# Patient Record
Sex: Male | Born: 1965 | Race: Black or African American | Hispanic: No | Marital: Single | Smoking: Never smoker
Health system: Southern US, Community
[De-identification: ages and names within clinical notes are randomized; demographics above are authoritative.]

## PROBLEM LIST (undated history)

## (undated) DIAGNOSIS — N400 Enlarged prostate without lower urinary tract symptoms: Secondary | ICD-10-CM

## (undated) HISTORY — DX: Benign prostatic hyperplasia without lower urinary tract symptoms: N40.0

---

## 2013-10-07 DIAGNOSIS — K56609 Unspecified intestinal obstruction, unspecified as to partial versus complete obstruction: Secondary | ICD-10-CM

## 2013-10-07 HISTORY — DX: Unspecified intestinal obstruction, unspecified as to partial versus complete obstruction: K56.609

## 2017-04-03 ENCOUNTER — Emergency Department: Payer: BC Managed Care – PPO

## 2017-04-03 ENCOUNTER — Encounter: Payer: Self-pay | Admitting: Emergency Medicine

## 2017-04-03 ENCOUNTER — Emergency Department
Admission: EM | Admit: 2017-04-03 | Discharge: 2017-04-03 | Disposition: A | Discharge status: Home / Self Care | Payer: BC Managed Care – PPO | Attending: Emergency Medicine | Admitting: Emergency Medicine

## 2017-04-03 DIAGNOSIS — R3911 Hesitancy of micturition: Secondary | ICD-10-CM | POA: Diagnosis not present

## 2017-04-03 DIAGNOSIS — R1030 Lower abdominal pain, unspecified: Secondary | ICD-10-CM | POA: Diagnosis not present

## 2017-04-03 DIAGNOSIS — R109 Unspecified abdominal pain: Secondary | ICD-10-CM | POA: Diagnosis present

## 2017-04-03 DIAGNOSIS — N50819 Testicular pain, unspecified: Secondary | ICD-10-CM

## 2017-04-03 LAB — CBC WITH DIFFERENTIAL/PLATELET
BASOS ABS: 0 10*3/uL (ref 0–0.1)
Basophils Relative: 1 %
Eosinophils Absolute: 0.2 10*3/uL (ref 0–0.7)
Eosinophils Relative: 3 %
HEMATOCRIT: 45.2 % (ref 40.0–52.0)
Hemoglobin: 15.1 g/dL (ref 13.0–18.0)
LYMPHS ABS: 2 10*3/uL (ref 1.0–3.6)
LYMPHS PCT: 34 %
MCH: 28 pg (ref 26.0–34.0)
MCHC: 33.5 g/dL (ref 32.0–36.0)
MCV: 83.5 fL (ref 80.0–100.0)
MONO ABS: 0.6 10*3/uL (ref 0.2–1.0)
Monocytes Relative: 10 %
NEUTROS ABS: 3 10*3/uL (ref 1.4–6.5)
Neutrophils Relative %: 52 %
Platelets: 226 10*3/uL (ref 150–440)
RBC: 5.41 MIL/uL (ref 4.40–5.90)
RDW: 13.8 % (ref 11.5–14.5)
WBC: 5.7 10*3/uL (ref 3.8–10.6)

## 2017-04-03 LAB — BASIC METABOLIC PANEL
ANION GAP: 9 (ref 5–15)
BUN: 12 mg/dL (ref 6–20)
CO2: 24 mmol/L (ref 22–32)
Calcium: 9.4 mg/dL (ref 8.9–10.3)
Chloride: 108 mmol/L (ref 101–111)
Creatinine, Ser: 1.16 mg/dL (ref 0.61–1.24)
GFR calc Af Amer: >60 mL/min (ref >60)
GFR calc non Af Amer: >60 mL/min (ref >60)
GLUCOSE: 95 mg/dL (ref 65–99)
POTASSIUM: 3.7 mmol/L (ref 3.5–5.1)
Sodium: 141 mmol/L (ref 135–145)

## 2017-04-03 LAB — URINALYSIS, COMPLETE (UACMP) WITH MICROSCOPIC
BACTERIA UA: NONE SEEN
BILIRUBIN URINE: NEGATIVE
Glucose, UA: NEGATIVE mg/dL
Hgb urine dipstick: NEGATIVE
KETONES UR: 20 mg/dL — AB
LEUKOCYTES UA: NEGATIVE
Nitrite: NEGATIVE
PROTEIN: NEGATIVE mg/dL
Specific Gravity, Urine: 1.025 (ref 1.005–1.030)
pH: 5 (ref 5.0–8.0)

## 2017-04-03 MED ORDER — SODIUM CHLORIDE 0.9 % IV BOLUS (SEPSIS)
1000.0000 mL | Freq: Once | INTRAVENOUS | Status: AC
  Administered 2017-04-03: 1000 mL via INTRAVENOUS

## 2017-04-03 MED ORDER — MORPHINE SULFATE (PF) 4 MG/ML IV SOLN
4.0000 mg | Freq: Once | INTRAVENOUS | Status: AC
  Administered 2017-04-03: 4 mg via INTRAVENOUS
  Filled 2017-04-03: qty 1

## 2017-04-03 MED ORDER — TAMSULOSIN HCL 0.4 MG PO CAPS: 0.4000 mg | ORAL_CAPSULE | Freq: Every day | ORAL | 0 refills | Status: DC

## 2017-04-03 MED ORDER — ONDANSETRON HCL 4 MG/2ML IJ SOLN
4.0000 mg | Freq: Once | INTRAMUSCULAR | Status: AC
  Administered 2017-04-03: 4 mg via INTRAVENOUS
  Filled 2017-04-03: qty 2

## 2017-04-03 NOTE — ED Provider Notes (Signed)
[facility] Emergency Department Provider Note   ____________________________________________   First MD Initiated Contact with Patient 04/03/17 0502     (approximate)  I have reviewed the triage vital signs and the nursing notes.   HISTORY  Chief Complaint Abdominal Pain    HPI Adam Woodward is a 51 y.o. male brought to the ED from home via EMS with a chief complaint of low abdominal pain. Patient reports low abdominal pain since January 2018. [facility] with no concrete findings. States he has had increased pain for the past 2 days with radiation to his scrotum. Denies penile discharge or STD exposure. States he is slower to start his stream of urination but denies dysuria or hematuria. Denies associated fever, chills, chest pain, shortness of breath, nausea, vomiting, diarrhea.Denies recent travel or trauma. Nothing makes his symptoms better or worse.   Past medical history None  There are no active problems to display for this patient.   History reviewed. No pertinent surgical history.  Prior to Admission medications   Medication Sig Start Date End Date Taking? Authorizing Provider  tamsulosin (FLOMAX) 0.4 MG CAPS capsule Take 1 capsule (0.4 mg total) by mouth daily. 04/03/17   Irean HongSung, Lee-Anne Flicker J, MD    Allergies Patient has no known allergies.  History reviewed. No pertinent family history.  Social History Social History  Substance Use Topics  . Smoking status: Never Smoker  . Smokeless tobacco: Never Used  . Alcohol use No    Review of Systems  Constitutional: No fever/chills. Eyes: No visual changes. ENT: No sore throat. Cardiovascular: Denies chest pain. Respiratory: Denies shortness of breath. Gastrointestinal: Positive for abdominal pain.  No nausea, no vomiting.  No diarrhea.  No constipation. Genitourinary: Negative for dysuria. Musculoskeletal: Negative for back pain. Skin: Negative for  rash. Neurological: Negative for headaches, focal weakness or numbness.   ____________________________________________   PHYSICAL EXAM:  VITAL SIGNS: ED Triage Vitals  Enc Vitals Group     BP --      Pulse Rate 04/03/17 0416 73     Resp 04/03/17 0416 18     Temp 04/03/17 0416 98.3 F (36.8 C)     Temp Source 04/03/17 0416 Oral     SpO2 04/03/17 0412 100 %     Weight 04/03/17 0416 200 lb (90.7 kg)     Height 04/03/17 0416 6\' 1"  (1.854 m)     Head Circumference --      Peak Flow --      Pain Score 04/03/17 0415 8     Pain Loc --      Pain Edu? --      Excl. in GC? --     Constitutional: Alert and oriented. Well appearing and in no acute distress. Eyes: Conjunctivae are normal. PERRL. EOMI. Head: Atraumatic. Nose: No congestion/rhinnorhea. Mouth/Throat: Mucous membranes are moist.  Oropharynx non-erythematous. Neck: No stridor.   Cardiovascular: Normal rate, regular rhythm. Grossly normal heart sounds.  Good peripheral circulation. Respiratory: Normal respiratory effort.  No retractions. Lungs CTAB. Gastrointestinal: Soft and mildly tender to palpation suprapubic area without rebound or guarding. No distention. No abdominal bruits. No CVA tenderness. Genitourinary: Circumcised male. No urethral discharge. Bilaterally distended testicles that are not swollen but tender to palpation. Strong bilateral cremasteric reflexes. No inguinal lymphadenopathy. No skin changes, vesicles or ulcers. Musculoskeletal: No lower extremity tenderness nor edema.  No joint effusions. Neurologic:  Normal speech and language. No gross focal neurologic  deficits are appreciated. No gait instability. Skin:  Skin is warm, dry and intact. No rash noted. Psychiatric: Mood and affect are normal. Speech and behavior are normal.  ____________________________________________   LABS (all labs ordered are listed, but only abnormal results are displayed)  Labs Reviewed  URINALYSIS, COMPLETE (UACMP) WITH  MICROSCOPIC - Abnormal; Notable for the following:       Result Value   Color, Urine YELLOW (*)    APPearance CLEAR (*)    Ketones, ur 20 (*)    Squamous Epithelial / LPF 0-5 (*)    All other components within normal limits  CBC WITH DIFFERENTIAL/PLATELET  BASIC METABOLIC PANEL   ____________________________________________  EKG  None ____________________________________________  RADIOLOGY  US Scrotum  Result Date: 04/03/2017 CLINICAL DATA:  Bilateral testicular pain. EXAM: SCROTAL ULTRASOUND DOPPLER ULTRASOUND OF THE TESTICLES COMPARISON:  CT same day. TECHNIQUE: Complete ultrasound examination of the testicles, epididymis, and other scrotal structures was performed. Color and spectral Doppler ultrasound were also utilized to evaluate blood flow to the testicles. FINDINGS: Right testicle Measurements: 4.5 x 2.1 x 2.6 cm. No mass or microlithiasis visualized. Left testicle Measurements: 4.2 x 1.8 x 3.2 cm. No mass or microlithiasis visualized. Right epididymis:  Normal in size and appearance. Left epididymis:  Normal in size and appearance. Hydrocele:  None visualized. Varicocele:  None visualized. Pulsed Doppler interrogation of both testes demonstrates normal low resistance arterial and venous waveforms bilaterally. IMPRESSION: Negative exam. Electronically Signed   By: Maisie Fus  Register   On: 04/03/2017 06:17   [place] Art/ven Flow Abd Pelv Doppler  Result Date: 04/03/2017 CLINICAL DATA:  Bilateral testicular pain. EXAM: SCROTAL ULTRASOUND DOPPLER ULTRASOUND OF THE TESTICLES COMPARISON:  CT same day. TECHNIQUE: Complete ultrasound examination of the testicles, epididymis, and other scrotal structures was performed. Color and spectral Doppler ultrasound were also utilized to evaluate blood flow to the testicles. FINDINGS: Right testicle Measurements: 4.5 x 2.1 x 2.6 cm. No mass or microlithiasis visualized. Left testicle Measurements: 4.2 x 1.8 x 3.2 cm. No mass or microlithiasis visualized.  Right epididymis:  Normal in size and appearance. Left epididymis:  Normal in size and appearance. Hydrocele:  None visualized. Varicocele:  None visualized. Pulsed Doppler interrogation of both testes demonstrates normal low resistance arterial and venous waveforms bilaterally. IMPRESSION: Negative exam. Electronically Signed   By: Maisie Fus  Register   On: 04/03/2017 06:17   Ct Renal Stone Study  Result Date: 04/03/2017 CLINICAL DATA:  Lower abdominal pain, chronic. EXAM: CT ABDOMEN AND PELVIS WITHOUT CONTRAST TECHNIQUE: Multidetector CT imaging of the abdomen and pelvis was performed following the standard protocol without IV contrast. COMPARISON:  None. FINDINGS: Lower chest: No acute abnormality. Hepatobiliary: No focal liver abnormality is seen. No gallstones, gallbladder wall thickening, or biliary dilatation. Pancreas: Unremarkable. No pancreatic ductal dilatation or surrounding inflammatory changes. Spleen: Normal in size without focal abnormality. Adrenals/Urinary Tract: Adrenal glands are unremarkable. Kidneys are normal, without renal calculi, focal lesion, or hydronephrosis. Bladder is unremarkable. Stomach/Bowel: Stomach is within normal limits. Appendix is normal. No evidence of bowel wall thickening, distention, or inflammatory changes. Vascular/Lymphatic: No significant vascular findings are present. No enlarged abdominal or pelvic lymph nodes. Reproductive: Unremarkable Other: No focal inflammation.  No ascites. Musculoskeletal: No significant skeletal lesion. IMPRESSION: No significant abnormality. Electronically Signed   By: Ellery Plunk M.D.   On: 04/03/2017 06:21    ____________________________________________   PROCEDURES  Procedure(s) performed: None  Procedures  Critical Care performed: No  ____________________________________________   INITIAL IMPRESSION / ASSESSMENT  AND PLAN / ED COURSE  Pertinent labs & imaging results that were available during my care of the  patient were reviewed by me and considered in my medical decision making (see chart for details).  51 year old male with lower abdominal pain since January, worse for the past 2 days with associated urinary hesitancy and bilateral testicular pain. In addition to basic lab work, will obtain urinalysis, bladder scan, testicular ultrasound and CT renal colic study. Reviewed OSH chart from patient's January visit; CT abdomen/pelvis at that time demonstrated diverticulosis.  Clinical Course as of Apr 04 727  Thu Apr 03, 2017  1610 Radiologist Dr. Register called with negative ultrasound results. Updated patient of laboratory and negative CT results. Awaiting urinalysis. Bladder scan revealed only 40 mL some bladder. Anticipate patient will be able to be discharged home after urinalysis. Will prescribe antibiotics if needed. Will refer to urology for follow-up.  [JS]  0719 Patient resting in no acute distress. Updated him of urinalysis results. Will start him on a trial of Flomax for symptoms of urinary hesitancy and referred to urology for follow-up. Strict return precautions given. Patient verbalizes understanding and agrees with plan of care.  [JS]    Clinical Course User Index [JS] Irean Hong, MD     ____________________________________________   FINAL CLINICAL IMPRESSION(S) / ED DIAGNOSES  Final diagnoses:  Lower abdominal pain  Urinary hesitancy      NEW MEDICATIONS STARTED DURING THIS VISIT:  New Prescriptions   TAMSULOSIN (FLOMAX) 0.4 MG CAPS CAPSULE    Take 1 capsule (0.4 mg total) by mouth daily.     Note:  This document was prepared using Dragon voice recognition software and may include unintentional dictation errors.    Irean Hong, MD 04/03/17 [phone]

## 2017-04-03 NOTE — ED Notes (Signed)
Patient transported to CT 

## 2017-04-03 NOTE — ED Notes (Signed)
Ice pack applied to top of rt. Hand.

## 2017-04-03 NOTE — ED Triage Notes (Signed)
Pt. States chronic lower abdominal pain.  Pt. States no change in medication or diet.  Pt. Denies vomiting.

## 2017-04-03 NOTE — Discharge Instructions (Signed)
1. Start Flomax daily. 2. Please call to scheduled an appointment with the urologist to evaluate urinary hesitancy. 3. Return to the ER for worsening symptoms, persistent vomiting, difficulty breathing or other concerns.

## 2017-04-03 NOTE — ED Notes (Signed)
Pt. Reports dysuria with slow urine stream.

## 2017-04-03 NOTE — ED Notes (Signed)
Bladder scan only showed 40 ml in bladder.

## 2017-10-06 ENCOUNTER — Encounter: Payer: Self-pay | Admitting: Emergency Medicine

## 2017-10-06 ENCOUNTER — Other Ambulatory Visit: Payer: Self-pay

## 2017-10-06 ENCOUNTER — Ambulatory Visit
Admission: EM | Admit: 2017-10-06 | Discharge: 2017-10-06 | Disposition: A | Discharge status: Home / Self Care | Payer: BC Managed Care – PPO | Attending: Family Medicine | Admitting: Family Medicine

## 2017-10-06 DIAGNOSIS — K5900 Constipation, unspecified: Secondary | ICD-10-CM | POA: Diagnosis not present

## 2017-10-06 DIAGNOSIS — Z87438 Personal history of other diseases of male genital organs: Secondary | ICD-10-CM

## 2017-10-06 DIAGNOSIS — N411 Chronic prostatitis: Secondary | ICD-10-CM | POA: Diagnosis not present

## 2017-10-06 LAB — URINALYSIS, COMPLETE (UACMP) WITH MICROSCOPIC
BILIRUBIN URINE: NEGATIVE
Bacteria, UA: NONE SEEN
Glucose, UA: NEGATIVE mg/dL
KETONES UR: 40 mg/dL — AB
LEUKOCYTES UA: NEGATIVE
NITRITE: NEGATIVE
Protein, ur: NEGATIVE mg/dL
SQUAMOUS EPITHELIAL / LPF: NONE SEEN
Specific Gravity, Urine: 1.02 (ref 1.005–1.030)
pH: 6 (ref 5.0–8.0)

## 2017-10-06 LAB — OCCULT BLOOD X 1 CARD TO LAB, STOOL: Fecal Occult Bld: NEGATIVE

## 2017-10-06 MED ORDER — TAMSULOSIN HCL 0.4 MG PO CAPS: 0.4000 mg | ORAL_CAPSULE | Freq: Every day | ORAL | 1 refills | Status: DC

## 2017-10-06 MED ORDER — DOCUSATE SODIUM 50 MG PO CAPS: 50.0000 mg | ORAL_CAPSULE | Freq: Two times a day (BID) | ORAL | 0 refills | Status: DC

## 2017-10-06 MED ORDER — SULFAMETHOXAZOLE-TRIMETHOPRIM 800-160 MG PO TABS: 1.0000 | ORAL_TABLET | Freq: Two times a day (BID) | ORAL | 0 refills | Status: DC

## 2017-10-06 NOTE — ED Provider Notes (Signed)
[facility]    CSN: [id number] Arrival date & time: 10/06/17  1153     History   Chief Complaint Chief Complaint  Patient presents with  . Abdominal Pain    HPI Adam Woodward is a 51 y.o. male.   The history is provided by the patient.  Abdominal Pain  Pain location:  Suprapubic Pain quality: aching   Pain radiates to:  Does not radiate Pain severity:  Mild Onset quality:  Gradual Duration:  2 days Timing:  Intermittent Progression:  Waxing and waning Context: not awakening from sleep, not eating, not laxative use, not medication withdrawal, not previous surgeries, not recent illness and not retching   Relieved by:  Nothing Worsened by:  Nothing Associated symptoms: constipation   Associated symptoms: no anorexia, no chest pain, no chills, no cough, no diarrhea, no dysuria, no fatigue, no fever, no hematemesis, no hematochezia, no hematuria, no melena, no nausea, no shortness of breath, no sore throat and no vomiting   Risk factors: no alcohol abuse     History reviewed. No pertinent past medical history.  There are no active problems to display for this patient.   History reviewed. No pertinent surgical history.     Home Medications    Prior to Admission medications   Medication Sig Start Date End Date Taking? Authorizing Provider  docusate sodium (COLACE) 50 MG capsule Take 1 capsule (50 mg total) by mouth 2 (two) times daily. 10/06/17   Duanne LimerickJones, Preslea Rhodus C, MD  sulfamethoxazole-trimethoprim (BACTRIM DS,SEPTRA DS) 800-160 MG tablet Take 1 tablet by mouth 2 (two) times daily. 10/06/17   Duanne LimerickJones, Vegas Fritze C, MD  tamsulosin (FLOMAX) 0.4 MG CAPS capsule Take 1 capsule (0.4 mg total) by mouth daily. 10/06/17   Duanne LimerickJones, Glendoris Nodarse C, MD    Family History History reviewed. No pertinent family history.  Social History Social History   Tobacco Use  . Smoking status: Never Smoker  . Smokeless tobacco: Never Used  Substance Use Topics  . Alcohol use: No  . Drug  use: No     Allergies   Patient has no known allergies.   Review of Systems Review of Systems  Constitutional: Negative for chills, fatigue and fever.  HENT: Negative for drooling, ear discharge, ear pain and sore throat.   Respiratory: Negative for cough, shortness of breath and wheezing.   Cardiovascular: Negative for chest pain, palpitations and leg swelling.  Gastrointestinal: Positive for abdominal pain and constipation. Negative for anorexia, blood in stool, diarrhea, hematemesis, hematochezia, melena, nausea and vomiting.  Endocrine: Negative for polydipsia.  Genitourinary: Positive for decreased urine volume. Negative for dysuria, frequency, hematuria, scrotal swelling, testicular pain and urgency.  Musculoskeletal: Negative for back pain, myalgias and neck pain.  Skin: Negative for rash.  Allergic/Immunologic: Negative for environmental allergies.  Neurological: Negative for dizziness and headaches.  Hematological: Does not bruise/bleed easily.  Psychiatric/Behavioral: Negative for suicidal ideas. The patient is not nervous/anxious.      Physical Exam Triage Vital Signs ED Triage Vitals  Enc Vitals Group     BP 10/06/17 1218 125/82     Pulse Rate 10/06/17 1218 91     Resp --      Temp 10/06/17 1218 98.5 F (36.9 C)     Temp Source 10/06/17 1218 Oral     SpO2 10/06/17 1218 99 %     Weight 10/06/17 1215 194 lb (88 kg)     Height 10/06/17 1215 6\' 1"  (1.854 m)     Head Circumference --  Peak Flow --      Pain Score 10/06/17 1216 5     Pain Loc --      Pain Edu? --      Excl. in GC? --    No data found.  Updated Vital Signs BP 125/82 (BP Location: Left Arm)   Pulse 91   Temp 98.5 F (36.9 C) (Oral)   Ht 6\' 1"  (1.854 m)   Wt 194 lb (88 kg)   SpO2 99%   BMI 25.60 kg/m   Visual Acuity Right Eye Distance:   Left Eye Distance:   Bilateral Distance:    Right Eye Near:   Left Eye Near:    Bilateral Near:     Physical Exam  Constitutional: He  appears well-developed.  HENT:  Head: Normocephalic.  Mouth/Throat: Oropharynx is clear and moist.  Eyes: EOM are normal. Pupils are equal, round, and reactive to light. No scleral icterus.  Cardiovascular: Normal rate, regular rhythm, normal heart sounds and intact distal pulses. Exam reveals no gallop.  No murmur heard. Pulmonary/Chest: Effort normal and breath sounds normal. He has no wheezes. He has no rhonchi. He has no rales.  Abdominal: Normal appearance and bowel sounds are normal. He exhibits no distension, no pulsatile liver, no abdominal bruit, no ascites, no pulsatile midline mass and no mass. There is no hepatosplenomegaly. There is tenderness in the suprapubic area. There is no rigidity, no rebound, no guarding and no CVA tenderness. No hernia.  Genitourinary: Rectum normal and testes normal. Rectal exam shows no mass and no tenderness. Prostate is tender. Prostate is not enlarged.  Skin: Skin is warm.  Vitals reviewed.    UC Treatments / Results  Labs (all labs ordered are listed, but only abnormal results are displayed) Labs Reviewed  URINALYSIS, COMPLETE (UACMP) WITH MICROSCOPIC - Abnormal; Notable for the following components:      Result Value   Hgb urine dipstick SMALL (*)    Ketones, ur 40 (*)    All other components within normal limits  OCCULT BLOOD X 1 CARD TO LAB, STOOL    EKG  EKG Interpretation None       Radiology No results found.  Procedures Procedures (including critical care time)  Medications Ordered in UC Medications - No data to display   Initial Impression / Assessment and Plan / UC Course  I have reviewed the triage vital signs and the nursing notes.  Pertinent labs & imaging results that were available during my care of the patient were reviewed by me and considered in my medical decision making (see chart for details).       Final Clinical Impressions(s) / UC Diagnoses   Final diagnoses:  Constipation, unspecified  constipation type  History of BPH  Subacute prostatitis    ED Discharge Orders        Ordered    tamsulosin (FLOMAX) 0.4 MG CAPS capsule  Daily     10/06/17 1631    sulfamethoxazole-trimethoprim (BACTRIM DS,SEPTRA DS) 800-160 MG tablet  2 times daily     10/06/17 1631    docusate sodium (COLACE) 50 MG capsule  2 times daily     10/06/17 1631       Controlled Substance Prescriptions  Controlled Substance Registry consulted? Not Applicable   Duanne LimerickJones, Iver Fehrenbach C, MD 10/06/17 [id number]

## 2017-10-06 NOTE — ED Triage Notes (Signed)
Patient c/o stomach pain that started on Friday.  Patient denies N/V/D.  Patient reports last bowel movement a small amount this morning.

## 2018-07-30 ENCOUNTER — Encounter: Payer: Self-pay | Admitting: Emergency Medicine

## 2018-07-30 ENCOUNTER — Emergency Department
Admission: EM | Admit: 2018-07-30 | Discharge: 2018-07-30 | Disposition: A | Discharge status: Home / Self Care | Payer: BC Managed Care – PPO | Attending: Emergency Medicine | Admitting: Emergency Medicine

## 2018-07-30 ENCOUNTER — Emergency Department: Payer: BC Managed Care – PPO

## 2018-07-30 DIAGNOSIS — Z79899 Other long term (current) drug therapy: Secondary | ICD-10-CM | POA: Diagnosis not present

## 2018-07-30 DIAGNOSIS — R824 Acetonuria: Secondary | ICD-10-CM

## 2018-07-30 DIAGNOSIS — K5900 Constipation, unspecified: Secondary | ICD-10-CM | POA: Insufficient documentation

## 2018-07-30 LAB — URINALYSIS, COMPLETE (UACMP) WITH MICROSCOPIC
Bacteria, UA: NONE SEEN
Glucose, UA: NEGATIVE mg/dL
Ketones, ur: 40 mg/dL — AB
Leukocytes, UA: NEGATIVE
Nitrite: NEGATIVE
PH: 6 (ref 5.0–8.0)
PROTEIN: NEGATIVE mg/dL
Specific Gravity, Urine: 1.025 (ref 1.005–1.030)

## 2018-07-30 LAB — CBC WITH DIFFERENTIAL/PLATELET
Abs Immature Granulocytes: 0.02 10*3/uL (ref 0.00–0.07)
BASOS PCT: 1 %
Basophils Absolute: 0 10*3/uL (ref 0.0–0.1)
EOS ABS: 0.1 10*3/uL (ref 0.0–0.5)
EOS PCT: 2 %
HCT: 44.5 % (ref 39.0–52.0)
Hemoglobin: 14.7 g/dL (ref 13.0–17.0)
Immature Granulocytes: 0 %
LYMPHS ABS: 1.4 10*3/uL (ref 0.7–4.0)
LYMPHS PCT: 30 %
MCH: 27.7 pg (ref 26.0–34.0)
MCHC: 33 g/dL (ref 30.0–36.0)
MCV: 84 fL (ref 80.0–100.0)
MONO ABS: 0.4 10*3/uL (ref 0.1–1.0)
MONOS PCT: 9 %
Neutro Abs: 2.7 10*3/uL (ref 1.7–7.7)
Neutrophils Relative %: 58 %
PLATELETS: 219 10*3/uL (ref 150–400)
RBC: 5.3 MIL/uL (ref 4.22–5.81)
RDW: 13.7 % (ref 11.5–15.5)
WBC: 4.6 10*3/uL (ref 4.0–10.5)
nRBC: 0 % (ref 0.0–0.2)

## 2018-07-30 LAB — COMPREHENSIVE METABOLIC PANEL
ALT: 17 U/L (ref 0–44)
AST: 16 U/L (ref 15–41)
Albumin: 4.5 g/dL (ref 3.5–5.0)
Alkaline Phosphatase: 53 U/L (ref 38–126)
Anion gap: 4 — ABNORMAL LOW (ref 5–15)
BUN: 16 mg/dL (ref 6–20)
CO2: 28 mmol/L (ref 22–32)
CREATININE: 1.27 mg/dL — AB (ref 0.61–1.24)
Calcium: 9.4 mg/dL (ref 8.9–10.3)
Chloride: 105 mmol/L (ref 98–111)
GFR calc Af Amer: >60 mL/min (ref >60)
GFR calc non Af Amer: >60 mL/min (ref >60)
Glucose, Bld: 94 mg/dL (ref 70–99)
Potassium: 4.2 mmol/L (ref 3.5–5.1)
Sodium: 137 mmol/L (ref 135–145)
Total Bilirubin: 0.9 mg/dL (ref 0.3–1.2)
Total Protein: 7.6 g/dL (ref 6.5–8.1)

## 2018-07-30 LAB — LIPASE, BLOOD: Lipase: 25 U/L (ref 11–51)

## 2018-07-30 MED ORDER — TAMSULOSIN HCL 0.4 MG PO CAPS: 0.4000 mg | ORAL_CAPSULE | Freq: Every day | ORAL | 0 refills | Status: AC

## 2018-07-30 MED ORDER — DOCUSATE SODIUM 50 MG PO CAPS: 50.0000 mg | ORAL_CAPSULE | Freq: Two times a day (BID) | ORAL | 0 refills | Status: DC

## 2018-07-30 NOTE — ED Notes (Addendum)
See triage note   Presents with abd discomfort and constipation  States last BM was last Friday  Denies any n/v  Also states he has not used any meds OTC

## 2018-07-30 NOTE — Discharge Instructions (Signed)
Please call today to schedule an appointment with your primary care provider. I also recommend that you see a urologist. Return to the ER for symptoms that change or worsen if unable to schedule an appointment.

## 2018-07-30 NOTE — ED Triage Notes (Signed)
Pt reports has not been able to have a bowel movement since Friday of last week. Pt reports had the same issue in the past one before and he went to the ED and they gave him a pill for it. Pt c/o abdominal pain. Denies nausea, SOB or other sx's.

## 2018-07-31 NOTE — ED Provider Notes (Signed)
[facility] Emergency Department Provider Note  ____________________________________________   None    (approximate)  I have reviewed the triage vital signs and the nursing notes.   HISTORY  Chief Complaint Constipation   HPI Adam Woodward is a 52 y.o. male who presents to the emergency department for treatment and evaluation of constipation and weak urine stream. He has had these symptoms in the past and was treated with Flomax and a pill that he believed was Bactrim. He denies a history of diverticulitis or colitis. He does have a history of enlarged prostate. He states he has abdominal pain and rectal pain only when straining to attempt to have a bowel movement. Last was 6 days ago. He denies nausea, vomiting, decrease in appetite, or fever.  History reviewed. No pertinent past medical history.  There are no active problems to display for this patient.   History reviewed. No pertinent surgical history.  Prior to Admission medications   Medication Sig Start Date End Date Taking? Authorizing Provider  docusate sodium (COLACE) 50 MG capsule Take 1 capsule (50 mg total) by mouth 2 (two) times daily. 07/30/18   Lamija Besse, Rulon Eisenmenger B, FNP  sulfamethoxazole-trimethoprim (BACTRIM DS,SEPTRA DS) 800-160 MG tablet Take 1 tablet by mouth 2 (two) times daily. 10/06/17   Duanne Limerick, MD  tamsulosin (FLOMAX) 0.4 MG CAPS capsule Take 1 capsule (0.4 mg total) by mouth daily. 07/30/18 08/29/18  Chinita Pester, FNP    Allergies Patient has no known allergies.  No family history on file.  Social History Social History   Tobacco Use  . Smoking status: Never Smoker  . Smokeless tobacco: Never Used  Substance Use Topics  . Alcohol use: No  . Drug use: No    Review of Systems  Constitutional: No fever/chills Eyes: No visual changes. ENT: No sore throat. Cardiovascular: Denies chest pain. Respiratory: Denies shortness of breath. Gastrointestinal: No  abdominal pain.  No nausea, no vomiting.  No diarrhea.  Positive for constipation. Genitourinary: Negative for dysuria. Musculoskeletal: Negative for back pain. Skin: Negative for rash. Neurological: Negative for headaches, focal weakness or numbness. ____________________________________________   PHYSICAL EXAM:  VITAL SIGNS: ED Triage Vitals [07/30/18 0826]  Enc Vitals Group     BP (!) 159/77     Pulse Rate 80     Resp 20     Temp 98.3 F (36.8 C)     Temp Source Oral     SpO2 98 %     Weight 193 lb (87.5 kg)     Height 6\' 1"  (1.854 m)     Head Circumference      Peak Flow      Pain Score 7     Pain Loc      Pain Edu?      Excl. in GC?     Constitutional: Alert and oriented. Well appearing and in no acute distress. Eyes: Conjunctivae are normal. Head: Atraumatic. Nose: No congestion/rhinnorhea. Mouth/Throat: Mucous membranes are moist. Neck: No stridor.   Cardiovascular: Normal rate, regular rhythm. Grossly normal heart sounds.  Good peripheral circulation. Respiratory: Normal respiratory effort.  No retractions. Lungs CTAB. Gastrointestinal: Soft and nontender. No distention. No abdominal bruits. Negative Murphy's sign. Bowel sounds active x 4.  Musculoskeletal: No lower extremity tenderness nor edema.  No joint effusions. Neurologic:  Normal speech and language. No gross focal neurologic deficits are appreciated. No gait instability. Skin:  Skin is warm, dry and intact. No rash noted. Psychiatric: Mood and affect  are normal. Speech and behavior are normal.  ____________________________________________   LABS (all labs ordered are listed, but only abnormal results are displayed)  Labs Reviewed  URINALYSIS, COMPLETE (UACMP) WITH MICROSCOPIC - Abnormal; Notable for the following components:      Result Value   Hgb urine dipstick TRACE (*)    Bilirubin Urine SMALL (*)    Ketones, ur 40 (*)    All other components within normal limits  COMPREHENSIVE METABOLIC  PANEL - Abnormal; Notable for the following components:   Creatinine, Ser 1.27 (*)    Anion gap 4 (*)    All other components within normal limits  CBC WITH DIFFERENTIAL/PLATELET  LIPASE, BLOOD   ____________________________________________  EKG  Not indicated ____________________________________________  RADIOLOGY  ED MD interpretation:  Scattered bowel gas with small amount of fecal material.  Official radiology report(s): No results found.  ____________________________________________   PROCEDURES  Procedure(s) performed: None  Procedures  Critical Care performed: No  ____________________________________________   INITIAL IMPRESSION / ASSESSMENT AND PLAN / ED COURSE  As part of my medical decision making, I reviewed the following data within the electronic MEDICAL RECORD NUMBER Notes from prior ED visits   52 year old male presenting with constipation and weak urine stream. X-ray does not show a significant amount of retained stool and does not appear concerning for small bowel obstruction.  Urinalysis showing trace hemoglobin, small bilirubin, and 40 ketones. He is not a diabetic and states that his appetite is normal. Labs are reassuring. He was advised of these findings and is to follow up with primary care or preferably urology. Prescriptions for colace and flomax provided today. He is to return to the ER for symptoms that change or worsen if unable to see PCP.      ____________________________________________   FINAL CLINICAL IMPRESSION(S) / ED DIAGNOSES  Final diagnoses:  Constipation, unspecified constipation type  Ketonuria     ED Discharge Orders         Ordered    tamsulosin (FLOMAX) 0.4 MG CAPS capsule  Daily     07/30/18 1108    docusate sodium (COLACE) 50 MG capsule  2 times daily     07/30/18 1108           Note:  This document was prepared using Dragon voice recognition software and may include unintentional dictation errors.      Chinita Pester, FNP 07/31/18 1020    Governor Rooks, MD 08/01/18 [phone]

## 2019-11-28 ENCOUNTER — Encounter: Payer: Self-pay | Admitting: Emergency Medicine

## 2019-11-28 ENCOUNTER — Emergency Department: Payer: BC Managed Care – PPO

## 2019-11-28 ENCOUNTER — Emergency Department
Admission: EM | Admit: 2019-11-28 | Discharge: 2019-11-28 | Disposition: A | Discharge status: Home / Self Care | Payer: BC Managed Care – PPO | Attending: Emergency Medicine | Admitting: Emergency Medicine

## 2019-11-28 ENCOUNTER — Other Ambulatory Visit: Payer: Self-pay

## 2019-11-28 DIAGNOSIS — R339 Retention of urine, unspecified: Secondary | ICD-10-CM | POA: Diagnosis not present

## 2019-11-28 DIAGNOSIS — K5900 Constipation, unspecified: Secondary | ICD-10-CM | POA: Diagnosis not present

## 2019-11-28 DIAGNOSIS — N400 Enlarged prostate without lower urinary tract symptoms: Secondary | ICD-10-CM | POA: Diagnosis not present

## 2019-11-28 LAB — COMPREHENSIVE METABOLIC PANEL
ALT: 15 U/L (ref 0–44)
AST: 15 U/L (ref 15–41)
Albumin: 4.3 g/dL (ref 3.5–5.0)
Alkaline Phosphatase: 57 U/L (ref 38–126)
Anion gap: 7 (ref 5–15)
BUN: 14 mg/dL (ref 6–20)
CO2: 29 mmol/L (ref 22–32)
Calcium: 9.1 mg/dL (ref 8.9–10.3)
Chloride: 104 mmol/L (ref 98–111)
Creatinine, Ser: 1.17 mg/dL (ref 0.61–1.24)
GFR calc Af Amer: >60 mL/min (ref >60)
GFR calc non Af Amer: >60 mL/min (ref >60)
Glucose, Bld: 96 mg/dL (ref 70–99)
Potassium: 3.6 mmol/L (ref 3.5–5.1)
Sodium: 140 mmol/L (ref 135–145)
Total Bilirubin: 0.9 mg/dL (ref 0.3–1.2)
Total Protein: 7.5 g/dL (ref 6.5–8.1)

## 2019-11-28 LAB — URINALYSIS, ROUTINE W REFLEX MICROSCOPIC
Bilirubin Urine: NEGATIVE
Glucose, UA: NEGATIVE mg/dL
Hgb urine dipstick: NEGATIVE
Ketones, ur: NEGATIVE mg/dL
Leukocytes,Ua: NEGATIVE
Nitrite: NEGATIVE
Protein, ur: NEGATIVE mg/dL
Specific Gravity, Urine: 1.03 (ref 1.005–1.030)
pH: 6 (ref 5.0–8.0)

## 2019-11-28 LAB — CBC WITH DIFFERENTIAL/PLATELET
Abs Immature Granulocytes: 0.02 10*3/uL (ref 0.00–0.07)
Basophils Absolute: 0 10*3/uL (ref 0.0–0.1)
Basophils Relative: 1 %
Eosinophils Absolute: 0.2 10*3/uL (ref 0.0–0.5)
Eosinophils Relative: 3 %
HCT: 40.8 % (ref 39.0–52.0)
Hemoglobin: 13.3 g/dL (ref 13.0–17.0)
Immature Granulocytes: 0 %
Lymphocytes Relative: 32 %
Lymphs Abs: 1.5 10*3/uL (ref 0.7–4.0)
MCH: 27.3 pg (ref 26.0–34.0)
MCHC: 32.6 g/dL (ref 30.0–36.0)
MCV: 83.8 fL (ref 80.0–100.0)
Monocytes Absolute: 0.5 10*3/uL (ref 0.1–1.0)
Monocytes Relative: 10 %
Neutro Abs: 2.6 10*3/uL (ref 1.7–7.7)
Neutrophils Relative %: 54 %
Platelets: 208 10*3/uL (ref 150–400)
RBC: 4.87 MIL/uL (ref 4.22–5.81)
RDW: 14.3 % (ref 11.5–15.5)
WBC: 4.8 10*3/uL (ref 4.0–10.5)
nRBC: 0 % (ref 0.0–0.2)

## 2019-11-28 LAB — LIPASE, BLOOD: Lipase: 24 U/L (ref 11–51)

## 2019-11-28 MED ORDER — TAMSULOSIN HCL 0.4 MG PO CAPS: 0.4000 mg | ORAL_CAPSULE | Freq: Every day | ORAL | 0 refills | Status: DC

## 2019-11-28 NOTE — Discharge Instructions (Signed)
You can have some constipation you can start taking MiraLAX 1 capful daily.  Get this over-the-counter.  For your difficulties with urinating it could be secondary to a slightly enlarged prostate.  You can take Flomax to help with the flow.  However you need to follow-up with the primary care doctor or urology.  This time there is no signs of you retaining a significant amount of urine that would require Foley placement however if you stop urinating completely or he develop worsening abdominal pain or any other concerns you should return to the ER.

## 2019-11-28 NOTE — ED Triage Notes (Signed)
Pt to ED via POV c/o difficulty with urination and bowel movements. Pt states that this has been going on for "a while" but it got worse on Thursday. Pt states that he is having trouble starting his stream and also not getting a lot of urine out. Pt is in NAD.

## 2019-11-28 NOTE — ED Provider Notes (Signed)
[facility] Emergency Department Provider Note  ____________________________________________   First MD Initiated Contact with Patient 11/28/19 [phone]     (approximate)  I have reviewed the triage vital signs and the nursing notes.   HISTORY  Chief Complaint Constipation and Urinary Retention    HPI Adam Woodward is a 54 y.o. male with history of enlarged prostate who comes in for constipation urinary retention.  Patient states that since Thursday he has had increased difficulty getting his urine out and some hard stools.  Denies really any abdominal pain maybe some when he had BM but none currently.  States that he did have a bowel movement this morning that was softer because he took some laxative.  He is unsure what the laxative was called.  States that it was a small amount.  His urine he was able to get a small amount out this morning.  He denies any abdominal pain.  His difficulty with urination has been going on intermittently for years now but seems to worsen since Thursday, nothing makes better, nothing makes it worse, moderate in nature.  Denies any vomiting, fevers.          History reviewed. No pertinent past medical history.  There are no problems to display for this patient.   History reviewed. No pertinent surgical history.  Prior to Admission medications   Medication Sig Start Date End Date Taking? Authorizing Provider  docusate sodium (COLACE) 50 MG capsule Take 1 capsule (50 mg total) by mouth 2 (two) times daily. 07/30/18   Triplett, Rulon Eisenmenger B, FNP  sulfamethoxazole-trimethoprim (BACTRIM DS,SEPTRA DS) 800-160 MG tablet Take 1 tablet by mouth 2 (two) times daily. 10/06/17   Duanne Limerick, MD    Allergies Patient has no known allergies.  No family history on file.  Social History Social History   Tobacco Use  . Smoking status: Never Smoker  . Smokeless tobacco: Never Used  Substance Use Topics  . Alcohol use: No  . Drug use: No       Review of Systems Constitutional: No fever/chills Eyes: No visual changes. ENT: No sore throat. Cardiovascular: Denies chest pain. Respiratory: Denies shortness of breath. Gastrointestinal: No abdominal pain.  No nausea, no vomiting.  No diarrhea.  Difficulty with hard stool Genitourinary: Negative for dysuria.  Difficulty with getting urine out Musculoskeletal: Negative for back pain. Skin: Negative for rash. Neurological: Negative for headaches, focal weakness or numbness. All other ROS negative ____________________________________________   PHYSICAL EXAM:  VITAL SIGNS: ED Triage Vitals  Enc Vitals Group     BP 11/28/19 0754 136/79     Pulse Rate 11/28/19 0754 75     Resp 11/28/19 0754 16     Temp 11/28/19 0754 97.9 F (36.6 C)     Temp Source 11/28/19 0754 Oral     SpO2 11/28/19 0754 100 %     Weight 11/28/19 0757 190 lb (86.2 kg)     Height 11/28/19 0757 6\' 1"  (1.854 m)     Head Circumference --      Peak Flow --      Pain Score 11/28/19 0757 0     Pain Loc --      Pain Edu? --      Excl. in GC? --     Constitutional: Alert and oriented. Well appearing and in no acute distress. Eyes: Conjunctivae are normal. EOMI. Head: Atraumatic. Nose: No congestion/rhinnorhea. Mouth/Throat: Mucous membranes are moist.   Neck: No stridor. Trachea Midline. FROM  Cardiovascular: Normal rate, regular rhythm. Grossly normal heart sounds.  Good peripheral circulation. Respiratory: Normal respiratory effort.  No retractions. Lungs CTAB. Gastrointestinal: Soft and nontender. No distention. No abdominal bruits.  Musculoskeletal: No lower extremity tenderness nor edema.  No joint effusions. Neurologic:  Normal speech and language. No gross focal neurologic deficits are appreciated.  Skin:  Skin is warm, dry and intact. No rash noted. Psychiatric: Mood and affect are normal. Speech and behavior are normal. GU: Rectal exam hemorrhoid felt but not thrombosed, no abscess, no stool  in the vault  ____________________________________________   LABS (all labs ordered are listed, but only abnormal results are displayed)  Labs Reviewed  URINALYSIS, ROUTINE W REFLEX MICROSCOPIC - Abnormal; Notable for the following components:      Result Value   Color, Urine YELLOW (*)    APPearance CLEAR (*)    All other components within normal limits  CBC WITH DIFFERENTIAL/PLATELET  COMPREHENSIVE METABOLIC PANEL  LIPASE, BLOOD   ____________________________________________   RADIOLOGY Robert Bellow, personally viewed and evaluated these images (plain radiographs) as part of my medical decision making, as well as reviewing the written report by the radiologist.  ED MD interpretation: Normal gas pattern  Official radiology report(s): DG Abdomen 1 View  Result Date: 11/28/2019 CLINICAL DATA:  Constipation, urinary retention EXAM: ABDOMEN - 1 VIEW COMPARISON:  07/30/2018 FINDINGS: The bowel gas pattern is normal. No radio-opaque calculi or other significant radiographic abnormality are seen. IMPRESSION: Negative. Electronically Signed   By: Jerilynn Mages.  Shick M.D.   On: 11/28/2019 09:08    ____________________________________________   PROCEDURES  Procedure(s) performed (including Critical Care):  Procedures   ____________________________________________   INITIAL IMPRESSION / ASSESSMENT AND PLAN / ED COURSE  Adam Woodward was evaluated in Emergency Department on 11/28/2019 for the symptoms described in the history of present illness. He was evaluated in the context of the global COVID-19 pandemic, which necessitated consideration that the patient might be at risk for infection with the SARS-CoV-2 virus that causes COVID-19. Institutional protocols and algorithms that pertain to the evaluation of patients at risk for COVID-19 are in a state of rapid change based on information released by regulatory bodies including the CDC and federal and state organizations. These policies and  algorithms were followed during the patient's care in the ED.    Patient is a well-appearing 54 year old who presents with difficulties with getting stool and urine out.  Patient's abdomen is soft and nontender without any rebound or guarding.  Low suspicion for acute pathology such as SBO, diverticulitis, perforation.  Patient was able to have a bowel movement this morning.  We will do a bladder scan to look for urinary retention.  Patient's been told that he has potentially a enlarged prostate.  He has been on Flomax previously with improvement in his urination.  States has not followed up with primary care doctor or urologist due to unable to afford the co-pay.  Will get UA to evaluate for UTI.  Will do rectal exam to evaluate for stool in the vault.  Will do x-ray to evaluate for stool burden.   Patient's bladder scan was 71 mL.  I also did a bedside ultrasound to confirm that patient was not retaining and there is no evidence of retaining.  Patient was able to give a UA without evidence of UTI.  Patient did not feel tender on his prostate.  Patient's x-ray was without evidence of significant stool burden.  Patient's labs are all reassuring with no  evidence of UTI, infection, anemia.  Discussed with patient the above results and reevaluated patient's abdomen.  Continues to be soft and nontender.  We discussed CT imaging but given that he has has no symptoms at this time will hold off due to low suspicion for acute pathology.  We discussed symptomatic management with MiraLAX for his constipation as well as restarting Flomax for potential BPH.  Reinstructed that patient needs to follow-up with urology or primary care doctor for further management.  We also discussed return precautions in regards to worsening pain or not getting any urine out  I discussed the provisional nature of ED diagnosis, the treatment so far, the ongoing plan of care, follow up appointments and return precautions with the patient and  any family or support people present. They expressed understanding and agreed with the plan, discharged home.   ____________________________________________   FINAL CLINICAL IMPRESSION(S) / ED DIAGNOSES   Final diagnoses:  Constipation, unspecified constipation type  Enlarged prostate      MEDICATIONS GIVEN DURING THIS VISIT:  Medications - No data to display   ED Discharge Orders         Ordered    tamsulosin (FLOMAX) 0.4 MG CAPS capsule  Daily     11/28/19 0941           Note:  This document was prepared using Dragon voice recognition software and may include unintentional dictation errors.   Concha Se, MD 11/28/19 [phone]

## 2019-11-28 NOTE — ED Notes (Addendum)
Pt trx to CT.  

## 2019-11-28 NOTE — ED Notes (Signed)
Bladder scan showed 71 ml, pt provided w/ urinal at bedside and is currently trying to void.

## 2019-12-16 ENCOUNTER — Encounter: Payer: Self-pay | Admitting: Nurse Practitioner

## 2019-12-16 ENCOUNTER — Ambulatory Visit: Payer: BC Managed Care – PPO | Admitting: Nurse Practitioner

## 2019-12-16 ENCOUNTER — Other Ambulatory Visit: Payer: Self-pay

## 2019-12-16 VITALS — BP 104/64 | HR 97 | Temp 98.2°F | Resp 16 | Ht 72.5 in | Wt 181.8 lb

## 2019-12-16 DIAGNOSIS — K5900 Constipation, unspecified: Secondary | ICD-10-CM

## 2019-12-16 DIAGNOSIS — N400 Enlarged prostate without lower urinary tract symptoms: Secondary | ICD-10-CM

## 2019-12-16 DIAGNOSIS — N401 Enlarged prostate with lower urinary tract symptoms: Secondary | ICD-10-CM

## 2019-12-16 DIAGNOSIS — R399 Unspecified symptoms and signs involving the genitourinary system: Secondary | ICD-10-CM

## 2019-12-16 HISTORY — DX: Constipation, unspecified: K59.00

## 2019-12-16 MED ORDER — TAMSULOSIN HCL 0.4 MG PO CAPS: 0.4000 mg | ORAL_CAPSULE | Freq: Every day | ORAL | 1 refills | Status: AC

## 2019-12-16 NOTE — Patient Instructions (Addendum)
It was nice to meet you today.   You have chronic constipation and would benefit from taking a bulking agent like METAMUCIL or CITRUCEL with drinking 2-3 extra glassed of water a day.   You can try Miralax (ask the pharmacist for cheaper generic) and try 1/2 to 1 dose every other day if need more help. These two may work better than your stool softener.   If this is too costly, ask the pharmacist what is recommended for constipation.   Complete the stool card to which looks for blood in your stool that you don't see with your eyes.    Please go to the lab today to check your thyroid and prostate check. The rest of your blood work from last month in the emergency room looked good.   Come back in 2 mos and we will see how that worked. We can do a complete physical exam at that time.   Yes, I recommend the COVID VACCINE:  Call [phone] you do not have internet or are registering for a minor.      Constipation, Adult Constipation is when a person:  Poops (has a bowel movement) fewer times in a week than normal.  Has a hard time pooping.  Has poop that is dry, hard, or bigger than normal. Follow these instructions at home: Eating and drinking   Eat foods that have a lot of fiber, such as: ? Fresh fruits and vegetables. ? Whole grains. ? Beans.  Eat less of foods that are high in fat, low in fiber, or overly processed, such as: ? [place] fries. ? Hamburgers. ? Cookies. ? Candy. ? Soda.  Drink enough fluid to keep your pee (urine) clear or pale yellow. General instructions  Exercise regularly or as told by your doctor.  Go to the restroom when you feel like you need to poop. Do not hold it in.  Take over-the-counter and prescription medicines only as told by your doctor. These include any fiber supplements.  Do pelvic floor retraining exercises, such as: ? Doing deep breathing while relaxing your lower belly (abdomen). ? Relaxing your pelvic floor while  pooping.  Watch your condition for any changes.  Keep all follow-up visits as told by your doctor. This is important. Contact a doctor if:  You have pain that gets worse.  You have a fever.  You have not pooped for 4 days.  You throw up (vomit).  You are not hungry.  You lose weight.  You are bleeding from the anus.  You have thin, pencil-like poop (stool). Get help right away if:  You have a fever, and your symptoms suddenly get worse.  You leak poop or have blood in your poop.  Your belly feels hard or bigger than normal (is bloated).  You have very bad belly pain.  You feel dizzy or you faint. This information is not intended to replace advice given to you by your health care provider. Make sure you discuss any questions you have with your health care provider. Document Revised: 09/05/2017 Document Reviewed: 03/13/2016 Elsevier Patient Education  2020 [employer].

## 2019-12-16 NOTE — Progress Notes (Signed)
New Patient Office Visit  Subjective:  Patient ID: Adam Woodward, male    DOB: 06-22-1966  Age: 54 y.o. MRN: [id number]  CC:  Chief Complaint  Patient presents with  . New Patient (Initial Visit)    HPI Adam Woodward presents to establish care with a PCP. He reports he needs refills of his BPH medication. Chart review shows Flomax was prescribed in the ED 03/2017 for lower abdomen pain, urinary hesitancy,  bilateral testicular pain. He underwent UA, bladder scan, testicular US, CT renal colic study all unremarkable. He was prescribed Flomax and referred to Urology.   He did not see Urology. He did return to the ED 10/06/2017, 07/30/2018, for constipation and intermittent lower abdominal pain despite [employer] brand colace, brief trial of Mira lax. He presented off Flomax and did not complete GI or GU referrals secondary to cost of co pay.    He was seen in the ED 11/28/2019 for constipation and difficulty urinating- off Flomax. He took a stool softener and did have a small BM. His UA was normal, ABD Xray showed no large stool burden, his labs were WNL, no tenderness of prostate on exam. He was advised to take  Miralax and re-start on his Flomax and establish care with PCP or Urology.  Today, he reports taking Metamucil and [employer] stool softener-off and on  with fair results. He has to strain to have good BM. No abdominal pain or GI complaints otherwise. No blood or melena in the stool. He has not tried Miralax or any other stimulant laxative. He has a normal urinary stream when he takes the Flomax and he wants to stay on that medication. He denies an GU concerns. He says that he is not interested in a lot of testing or expensive medications.    Chart review shows a hx of chronic constipation and underwent a colonoscopy in 2014 for this problem and pathology report  reviewed by me showed no adenomatous polyps and repeat in 2024.   Past Medical History:  Diagnosis Date  .  Constipation 12/16/2019    History reviewed. No pertinent surgical history.  Family History  Problem Relation Age of Onset  . Cancer Mother   . Heart Problems Mother     Social History   Socioeconomic History  . Marital status: Single    Spouse name: Not on file  . Number of children: Not on file  . Years of education: Not on file  . Highest education level: Not on file  Occupational History  . Not on file  Tobacco Use  . Smoking status: Never Smoker  . Smokeless tobacco: Never Used  Substance and Sexual Activity  . Alcohol use: No  . Drug use: No  . Sexual activity: Never  Other Topics Concern  . Not on file  Social History Narrative  . Not on file   Social Determinants of Health   Financial Resource Strain:   . Difficulty of Paying Living Expenses:   Food Insecurity:   . Worried About Charity fundraiser in the Last Year:   . Arboriculturist in the Last Year:   Transportation Needs:   . Film/video editor (Medical):   Marland Kitchen Lack of Transportation (Non-Medical):   Physical Activity:   . Days of Exercise per Week:   . Minutes of Exercise per Session:   Stress:   . Feeling of Stress :   Social Connections:   . Frequency of Communication with Friends and  Family:   . Frequency of Social Gatherings with Friends and Family:   . Attends Religious Services:   . Active Member of Clubs or Organizations:   . Attends Banker Meetings:   Marland Kitchen Marital Status:   Intimate Partner Violence:   . Fear of Current or Ex-Partner:   . Emotionally Abused:   Marland Kitchen Physically Abused:   . Sexually Abused:     Review of Systems  Constitutional: Positive for appetite change.       He changed his diet- less fried foods and wt loss from 196 lb to current 181 lbs. He was not eating properly and now tries to eat less fried and more veggies.   HENT: Negative.   Respiratory: Negative.   Cardiovascular: Negative.   Gastrointestinal: Positive for constipation. Negative for  abdominal pain, blood in stool, diarrhea, nausea, rectal pain and vomiting.  Genitourinary: Positive for testicular pain. Negative for decreased urine volume, difficulty urinating, dysuria and flank pain.  Musculoskeletal: Negative.   Skin: Negative.   Hematological: Negative.   Psychiatric/Behavioral:       He denies depression/anxiety.    Objective:   Today's Vitals: BP 104/64   Pulse 97   Temp 98.2 F (36.8 C) (Temporal)   Resp 16   Ht 6' 0.5" (1.842 m)   Wt 181 lb 12.8 oz (82.5 kg)   SpO2 96%   BMI 24.32 kg/m   Physical Exam Vitals reviewed.  Constitutional:      General: He is not in acute distress.    Appearance: Normal appearance. He is normal weight.  Eyes:     Pupils: Pupils are equal, round, and reactive to light.  Cardiovascular:     Rate and Rhythm: Normal rate and regular rhythm.     Heart sounds: Normal heart sounds. No murmur.  Pulmonary:     Effort: Pulmonary effort is normal.     Breath sounds: Normal breath sounds.  Abdominal:     General: Abdomen is flat.     Palpations: Abdomen is soft. There is no mass.     Tenderness: There is no abdominal tenderness.     Hernia: No hernia is present.  Musculoskeletal:        General: Normal range of motion.     Cervical back: Normal range of motion and neck supple.  Skin:    General: Skin is warm and dry.     Findings: No rash.  Neurological:     Mental Status: He is alert and oriented to person, place, and time.  Psychiatric:        Mood and Affect: Mood normal.        Behavior: Behavior normal.     Assessment & Plan:   Problem List Items Addressed This Visit      Other   Constipation   Relevant Orders   TSH    Other Visit Diagnoses    Benign prostatic hyperplasia with lower urinary tract symptoms, symptom details unspecified    -  Primary   Relevant Medications   tamsulosin (FLOMAX) 0.4 MG CAPS capsule   Other Relevant Orders   PSA     You have chronic constipation and would benefit from  taking a bulking agent like METAMUCIL or CITRUCEL with drinking 2-3 extra glassed of water a day.   You can try Mira lax (ask the pharmacist for cheaper generic) and try 1/2 to 1 dose every other day if need more help. These two may work better than your  stool softener.   If this is too costly, ask the pharmacist what is recommended for constipation.   Complete the stool card to which looks for blood in your stool that you don't see with your eyes.   Please go to the lab today to check your thyroid and prostate check. The rest of your blood work from last month in the emergency room looked good.   Come back in 2 mos and we will see how that worked. We can do a complete physical exam at that time.   Yes, I recommend the COVID VACCINE:  Call [phone] you do not have internet or are registering for a minor Today, no complaints   Outpatient Encounter Medications as of 12/16/2019  Medication Sig  . docusate sodium (COLACE) 50 MG capsule Take 1 capsule (50 mg total) by mouth 2 (two) times daily.  . tamsulosin (FLOMAX) 0.4 MG CAPS capsule Take 1 capsule (0.4 mg total) by mouth daily.  . [DISCONTINUED] tamsulosin (FLOMAX) 0.4 MG CAPS capsule Take 1 capsule (0.4 mg total) by mouth daily.  . [DISCONTINUED] sulfamethoxazole-trimethoprim (BACTRIM DS,SEPTRA DS) 800-160 MG tablet Take 1 tablet by mouth 2 (two) times daily.   No facility-administered encounter medications on file as of 12/16/2019.    Follow-up: Return in about 3 months (around 03/17/2020) for CPE .   Amedeo Kinsman, NP

## 2019-12-17 LAB — PSA: PSA: 1.15 ng/mL (ref 0.10–4.00)

## 2019-12-17 LAB — TSH: TSH: 0.93 u[IU]/mL (ref 0.35–4.50)

## 2019-12-19 ENCOUNTER — Telehealth: Payer: Self-pay | Admitting: Nurse Practitioner

## 2019-12-19 ENCOUNTER — Encounter: Payer: Self-pay | Admitting: Nurse Practitioner

## 2019-12-19 DIAGNOSIS — N400 Enlarged prostate without lower urinary tract symptoms: Secondary | ICD-10-CM | POA: Insufficient documentation

## 2019-12-19 NOTE — Telephone Encounter (Signed)
Please call him on MON and give normal lab results.   Check with him on update of constipation and how he is doing weight new start Miralax 1/2 packet as needed in addition to Metamucil. Is it working?   Ask him if can do the hemoccult cards.He may have questions.

## 2019-12-20 ENCOUNTER — Telehealth: Payer: Self-pay | Admitting: Nurse Practitioner

## 2019-12-20 ENCOUNTER — Other Ambulatory Visit: Payer: BC Managed Care – PPO

## 2019-12-20 DIAGNOSIS — K5900 Constipation, unspecified: Secondary | ICD-10-CM

## 2019-12-20 DIAGNOSIS — R195 Other fecal abnormalities: Secondary | ICD-10-CM

## 2019-12-20 LAB — FECAL OCCULT BLOOD, IMMUNOCHEMICAL: Fecal Occult Bld: POSITIVE — AB

## 2019-12-20 NOTE — Telephone Encounter (Signed)
Patient was informed of results.  Patient understood and no questions, comments, or concerns at this time. Patient is coming to pick up hemoccult cards.

## 2019-12-20 NOTE — Telephone Encounter (Signed)
His Hemoccult test was POSITIVE and he has constipation.  Most recent colonoscopy was in 2014.   PLAN: He needs to see GI for a repeat colonoscopy.  I spoke to him and he is agreeable. He would like the appt ASAP.

## 2019-12-21 ENCOUNTER — Encounter: Payer: Self-pay | Admitting: Gastroenterology

## 2019-12-22 ENCOUNTER — Other Ambulatory Visit: Payer: BC Managed Care – PPO

## 2020-01-14 ENCOUNTER — Other Ambulatory Visit: Payer: Self-pay

## 2020-01-14 ENCOUNTER — Encounter: Payer: Self-pay | Admitting: Gastroenterology

## 2020-01-14 ENCOUNTER — Ambulatory Visit (INDEPENDENT_AMBULATORY_CARE_PROVIDER_SITE_OTHER): Payer: BC Managed Care – PPO | Admitting: Gastroenterology

## 2020-01-14 VITALS — BP 132/70 | HR 66 | Temp 97.3°F | Ht 72.0 in | Wt 184.1 lb

## 2020-01-14 DIAGNOSIS — R195 Other fecal abnormalities: Secondary | ICD-10-CM | POA: Diagnosis not present

## 2020-01-14 MED ORDER — NA SULFATE-K SULFATE-MG SULF 17.5-3.13-1.6 GM/177ML PO SOLN: 1.0000 | Freq: Once | ORAL | 0 refills | Status: AC

## 2020-01-14 NOTE — Progress Notes (Signed)
     01/14/2020 Adam Woodward [id number] 1966-02-04   HISTORY OF PRESENT ILLNESS:  This is a pleasant 54 year old male who is new to our office.  He presents here today at the request of his PCP, Amedeo Kinsman, NP, for evaluation regarding positive fecal immunochemistry testing.  Patient had colonoscopy in 11/2012 at [facility] at which time it looks like he had hyperplastic polyp removed as well as a lymphoid nodule and they recommended repeat colonoscopy in 10 years.  His PCP recently performed fecal immunochemistry that was positive.  Hgb is normal.  He denies seeing blood in his stools or black stools.  He reports some constipation, but has been using Miralax and it has been better with that.   Past Medical History:  Diagnosis Date  . Bowel obstruction (HCC) 2015  . BPH (benign prostatic hyperplasia)   . Constipation 12/16/2019  . Constipation    History reviewed. No pertinent surgical history.  reports that he has never smoked. He has never used smokeless tobacco. He reports that he does not drink alcohol or use drugs. family history includes Alcohol abuse in his father; Breast cancer in his mother; Heart Problems in his mother; Liver cancer in his mother. No Known Allergies    Outpatient Encounter Medications as of 01/14/2020  Medication Sig  . docusate sodium (COLACE) 50 MG capsule Take 1 capsule (50 mg total) by mouth 2 (two) times daily.  . polyethylene glycol (MIRALAX / GLYCOLAX) 17 g packet Take 17 g by mouth daily.  . tamsulosin (FLOMAX) 0.4 MG CAPS capsule Take 1 capsule (0.4 mg total) by mouth daily.   No facility-administered encounter medications on file as of 01/14/2020.     REVIEW OF SYSTEMS  : All other systems reviewed and negative except where noted in the History of Present Illness.   PHYSICAL EXAM: BP 132/70   Pulse 66   Temp (!) 97.3 F (36.3 C)   Ht 6' (1.829 m)   Wt 184 lb 2 oz (83.5 kg)   BMI 24.97 kg/m  General: Well developed white male in no acute  distress Head: Normocephalic and atraumatic Eyes:  sclerae anicteric,conjunctive pink. Ears: Normal auditory acuity Lungs: Clear throughout to auscultation; no increased WOB. Heart: Regular rate and rhythm Abdomen: Soft, nontender, non distended. No masses or hepatomegaly noted. Normal bowel sounds Rectal:  Will be done at the time of colonoscopy. Musculoskeletal: Symmetrical with no gross deformities  Skin: No lesions on visible extremities Extremities: No edema  Neurological: Alert oriented x 4, grossly non-focal Psychological:  Alert and cooperative. Normal mood and affect  ASSESSMENT AND PLAN: *Positive fecal immunochemistry:  Last colonoscopy in 11/2012 at [facility] with hyperplastic polyp.  No sign of overt bleeding.  Hgb normal.  Will plan for colonoscopy with Dr. Myrtie Neither.  The risks, benefits, and alternatives to colonoscopy were discussed with the patient and he consents to proceed.    CC:  [name], NP

## 2020-01-14 NOTE — Patient Instructions (Addendum)
If you are age 55 or older, your body mass index should be between 23-30. Your Body mass index is 24.97 kg/m. If this is out of the aforementioned range listed, please consider follow up with your Primary Care Provider.  If you are age 43 or younger, your body mass index should be between 19-25. Your Body mass index is 24.97 kg/m. If this is out of the aformentioned range listed, please consider follow up with your Primary Care Provider.    We have sent the following medications to your pharmacy for you to pick up at your convenience:  suprep  Due to recent changes in healthcare laws, you may see the results of your imaging and laboratory studies on MyChart before your provider has had a chance to review them.  We understand that in some cases there may be results that are confusing or concerning to you. Not all laboratory results come back in the same time frame and the provider may be waiting for multiple results in order to interpret others.  Please give [place] 48 hours in order for your provider to thoroughly review all the results before contacting the office for clarification of your results.

## 2020-01-14 NOTE — Progress Notes (Signed)
____________________________________________________________  Attending physician addendum:  Thank you for sending this case to me. I have reviewed the entire note, and the outlined plan seems appropriate.  Azion Centrella Danis, MD  ____________________________________________________________  

## 2020-01-17 ENCOUNTER — Other Ambulatory Visit: Payer: Self-pay

## 2020-01-17 ENCOUNTER — Other Ambulatory Visit
Admission: RE | Admit: 2020-01-17 | Discharge: 2020-01-17 | Disposition: A | Discharge status: Home / Self Care | Payer: BC Managed Care – PPO | Source: Ambulatory Visit | Attending: Gastroenterology | Admitting: Gastroenterology

## 2020-01-17 DIAGNOSIS — Z01812 Encounter for preprocedural laboratory examination: Secondary | ICD-10-CM | POA: Insufficient documentation

## 2020-01-17 DIAGNOSIS — Z20822 Contact with and (suspected) exposure to covid-19: Secondary | ICD-10-CM | POA: Insufficient documentation

## 2020-01-17 LAB — SARS CORONAVIRUS 2 (TAT 6-24 HRS): SARS Coronavirus 2: NEGATIVE

## 2020-01-19 ENCOUNTER — Encounter: Payer: Self-pay | Admitting: Gastroenterology

## 2020-01-19 ENCOUNTER — Other Ambulatory Visit: Payer: Self-pay

## 2020-01-19 ENCOUNTER — Ambulatory Visit (AMBULATORY_SURGERY_CENTER): Payer: BC Managed Care – PPO | Admitting: Gastroenterology

## 2020-01-19 VITALS — BP 119/66 | HR 52 | Temp 95.3°F | Resp 17 | Ht 72.0 in | Wt 184.0 lb

## 2020-01-19 DIAGNOSIS — Z9889 Other specified postprocedural states: Secondary | ICD-10-CM | POA: Insufficient documentation

## 2020-01-19 DIAGNOSIS — R195 Other fecal abnormalities: Secondary | ICD-10-CM | POA: Diagnosis present

## 2020-01-19 DIAGNOSIS — K5730 Diverticulosis of large intestine without perforation or abscess without bleeding: Secondary | ICD-10-CM | POA: Diagnosis not present

## 2020-01-19 HISTORY — PX: COLONOSCOPY: SHX174

## 2020-01-19 MED ORDER — SODIUM CHLORIDE 0.9 % IV SOLN: 500.0000 mL | Freq: Once | INTRAVENOUS | Status: DC

## 2020-01-19 NOTE — Patient Instructions (Signed)
Thank you for allowing us to care for you today!  Recommend next screening colonoscopy in 10 years.  Resume previous diet and medications today.  Return to your normal activities tomorrow.     YOU HAD AN ENDOSCOPIC PROCEDURE TODAY AT THE [facility]:   Refer to the procedure report that was given to you for any specific questions about what was found during the examination.  If the procedure report does not answer your questions, please call your gastroenterologist to clarify.  If you requested that your care partner not be given the details of your procedure findings, then the procedure report has been included in a sealed envelope for you to review at your convenience later.  YOU SHOULD EXPECT: Some feelings of bloating in the abdomen. Passage of more gas than usual.  Walking can help get rid of the air that was put into your GI tract during the procedure and reduce the bloating. If you had a lower endoscopy (such as a colonoscopy or flexible sigmoidoscopy) you may notice spotting of blood in your stool or on the toilet paper. If you underwent a bowel prep for your procedure, you may not have a normal bowel movement for a few days.  Please Note:  You might notice some irritation and congestion in your nose or some drainage.  This is from the oxygen used during your procedure.  There is no need for concern and it should clear up in a day or so.  SYMPTOMS TO REPORT IMMEDIATELY:   Following lower endoscopy (colonoscopy or flexible sigmoidoscopy):  Excessive amounts of blood in the stool  Significant tenderness or worsening of abdominal pains  Swelling of the abdomen that is new, acute  Fever of 100F or higher   For urgent or emergent issues, a gastroenterologist can be reached at any hour by calling [phone]. Do not use MyChart messaging for urgent concerns.    DIET:  We do recommend a small meal at first, but then you may proceed to your regular diet.  Drink plenty of  fluids but you should avoid alcoholic beverages for 24 hours.  ACTIVITY:  You should plan to take it easy for the rest of today and you should NOT DRIVE or use heavy machinery until tomorrow (because of the sedation medicines used during the test).    FOLLOW UP: Our staff will call the number listed on your records 48-72 hours following your procedure to check on you and address any questions or concerns that you may have regarding the information given to you following your procedure. If we do not reach you, we will leave a message.  We will attempt to reach you two times.  During this call, we will ask if you have developed any symptoms of COVID 19. If you develop any symptoms (ie: fever, flu-like symptoms, shortness of breath, cough etc.) before then, please call [phone].  If you test positive for Covid 19 in the 2 weeks post procedure, please call and report this information to us.    If any biopsies were taken you will be contacted by phone or by letter within the next 1-3 weeks.  Please call us at [phone] if you have not heard about the biopsies in 3 weeks.    SIGNATURES/CONFIDENTIALITY: You and/or your care partner have signed paperwork which will be entered into your electronic medical record.  These signatures attest to the fact that that the information above on your After Visit Summary has been reviewed and   is understood.  Full responsibility of the confidentiality of this discharge information lies with you and/or your care-partner. 

## 2020-01-19 NOTE — Op Note (Signed)
[facility] Patient Name: Adam Woodward Procedure Date: 01/19/2020 9:09 AM MRN: [id number] Endoscopist: Sherilyn Cooter L. Myrtie Neither , MD Age: 54 Referring MD:  Date of Birth: 1966/05/20 Gender: Male Account #: [id number] Procedure:                Colonoscopy Indications:              Heme positive stool (normal Hemoglobin) Medicines:                Monitored Anesthesia Care Procedure:                Pre-Anesthesia Assessment:                           - Prior to the procedure, a History and Physical                            was performed, and patient medications and                            allergies were reviewed. The patient's tolerance of                            previous anesthesia was also reviewed. The risks                            and benefits of the procedure and the sedation                            options and risks were discussed with the patient.                            All questions were answered, and informed consent                            was obtained. Prior Anticoagulants: The patient has                            taken no previous anticoagulant or antiplatelet                            agents. ASA Grade Assessment: II - A patient with                            mild systemic disease. After reviewing the risks                            and benefits, the patient was deemed in                            satisfactory condition to undergo the procedure.                           After obtaining informed consent, the colonoscope  was passed under direct vision. Throughout the                            procedure, the patient's blood pressure, pulse, and                            oxygen saturations were monitored continuously. The                            Colonoscope was introduced through the anus and                            advanced to the the cecum, identified by                            appendiceal orifice and ileocecal  valve. The                            colonoscopy was performed without difficulty. The                            patient tolerated the procedure well. The quality                            of the bowel preparation was excellent. The                            ileocecal valve, appendiceal orifice, and rectum                            were photographed. Scope In: 9:27:40 AM Scope Out: 9:40:37 AM Scope Withdrawal Time: 0 hours 9 minutes 11 seconds  Total Procedure Duration: 0 hours 12 minutes 57 seconds  Findings:                 The perianal and digital rectal examinations were                            normal.                           Multiple small-mouthed diverticula were found in                            the left colon and right colon.                           The exam was otherwise without abnormality on                            direct and retroflexion views. Complications:            No immediate complications. Estimated Blood Loss:     Estimated blood loss: none. Impression:               - Diverticulosis in the left colon and in the right  colon.                           - The examination was otherwise normal on direct                            and retroflexion views.                           - No specimens collected.                           Apparent false positive stool test. Recommendation:           - Patient has a contact number available for                            emergencies. The signs and symptoms of potential                            delayed complications were discussed with the                            patient. Return to normal activities tomorrow.                            Written discharge instructions were provided to the                            patient.                           - Resume previous diet.                           - Continue present medications.                           - Repeat colonoscopy in 10  years for screening                            purposes. Further routine stool testing for occult                            blood will not be necessary with plans for                            endoscopic screening. Marlys Stegmaier L. Loletha Carrow, MD 01/19/2020 9:47:23 AM This report has been signed electronically.

## 2020-01-19 NOTE — Progress Notes (Signed)
pt tolerated well. VSS. awake and to recovery. Report given to RN.  

## 2020-01-19 NOTE — Progress Notes (Signed)
LC- temp CW- vitals 

## 2020-01-21 ENCOUNTER — Telehealth: Payer: Self-pay

## 2020-01-21 NOTE — Telephone Encounter (Signed)
  Follow up Call-  Call back number 01/19/2020  Post procedure Call Back phone  # [phone]  Permission to leave phone message Yes  Some recent data might be hidden     Patient questions:  Do you have a fever, pain , or abdominal swelling? No. Pain Score  0 *  Have you tolerated food without any problems? Yes.    Have you been able to return to your normal activities? Yes.    Do you have any questions about your discharge instructions: Diet   No. Medications  No. Follow up visit  No.  Do you have questions or concerns about your Care? No.  Actions: * If pain score is 4 or above: No action needed, pain <4.  Have you developed a fever since your procedure? No 2.   Have you had an respiratory symptoms (SOB or cough) since your procedure? No  3.   Have you tested positive for COVID 19 since your procedure No  4.   Have you had any family members/close contacts diagnosed with the COVID 19 since your procedure?  No   If yes to any of these questions please route to Laverna Peace, RN and Charlett Lango, RN

## 2020-02-17 ENCOUNTER — Encounter: Payer: BC Managed Care – PPO | Admitting: Nurse Practitioner

## 2020-02-23 ENCOUNTER — Other Ambulatory Visit: Payer: Self-pay

## 2020-02-23 ENCOUNTER — Ambulatory Visit (INDEPENDENT_AMBULATORY_CARE_PROVIDER_SITE_OTHER): Payer: BC Managed Care – PPO | Admitting: Nurse Practitioner

## 2020-02-23 ENCOUNTER — Encounter: Payer: Self-pay | Admitting: Nurse Practitioner

## 2020-02-23 VITALS — BP 120/78 | HR 70 | Temp 97.8°F | Ht 72.0 in | Wt 184.0 lb

## 2020-02-23 DIAGNOSIS — Z9889 Other specified postprocedural states: Secondary | ICD-10-CM | POA: Diagnosis not present

## 2020-02-23 DIAGNOSIS — Z0000 Encounter for general adult medical examination without abnormal findings: Secondary | ICD-10-CM | POA: Diagnosis not present

## 2020-02-23 DIAGNOSIS — N400 Enlarged prostate without lower urinary tract symptoms: Secondary | ICD-10-CM

## 2020-02-23 DIAGNOSIS — K5901 Slow transit constipation: Secondary | ICD-10-CM

## 2020-02-23 LAB — HEMOGLOBIN A1C: Hgb A1c MFr Bld: 5.3 % (ref 4.6–6.5)

## 2020-02-23 LAB — LIPID PANEL
Cholesterol: 157 mg/dL (ref 0–200)
HDL: 48.7 mg/dL (ref >39.00)
LDL Cholesterol: 96 mg/dL (ref 0–99)
NonHDL: 108.15
Total CHOL/HDL Ratio: 3
Triglycerides: 63 mg/dL (ref 0.0–149.0)
VLDL: 12.6 mg/dL (ref 0.0–40.0)

## 2020-02-23 NOTE — Patient Instructions (Addendum)
I recommend getting the Covid vaccine.  To get the vaccine- call  [phone] to schedule an appointment or walk-in [address] [place], [place] [address]   You are due for many adult vaccines- but they can't be started until 3 months after your last Covid vaccine.   Please follow up in late Sept and we can start to catch you up on these adult vaccines.   Vision: 2018- recommend call eye doctor for exam  Dental: not UTD- recommend call dentis for teeth cleaning and check   To the lab today  Continue on the Flomax for your prostate.  Continue on the Miralax for constipation as it is working well.    Preventive Care 31-37 Years Old, Male Preventive care refers to lifestyle choices and visits with your health care provider that can promote health and wellness. This includes:  A yearly physical exam. This is also called an annual well check.  Regular dental and eye exams.  Immunizations.  Screening for certain conditions.  Healthy lifestyle choices, such as eating a healthy diet, getting regular exercise, not using drugs or products that contain nicotine and tobacco, and limiting alcohol use. What can I expect for my preventive care visit? Physical exam Your health care provider will check:  Height and weight. These may be used to calculate body mass index (BMI), which is a measurement that tells if you are at a healthy weight.  Heart rate and blood pressure.  Your skin for abnormal spots. Counseling Your health care provider may ask you questions about:  Alcohol, tobacco, and drug use.  Emotional well-being.  Home and relationship well-being.  Sexual activity.  Eating habits.  Work and work Statistician. What immunizations do I need?  Influenza (flu) vaccine  This is recommended every year. Tetanus, diphtheria, and pertussis (Tdap) vaccine  You may need a Td booster every 10 years. Varicella (chickenpox) vaccine  You may need this vaccine if you have  not already been vaccinated. Zoster (shingles) vaccine  You may need this after age 16. Measles, mumps, and rubella (MMR) vaccine  You may need at least one dose of MMR if you were born in 1957 or later. You may also need a second dose. Pneumococcal conjugate (PCV13) vaccine  You may need this if you have certain conditions and were not previously vaccinated. Pneumococcal polysaccharide (PPSV23) vaccine  You may need one or two doses if you smoke cigarettes or if you have certain conditions. Meningococcal conjugate (MenACWY) vaccine  You may need this if you have certain conditions. Hepatitis A vaccine  You may need this if you have certain conditions or if you travel or work in places where you may be exposed to hepatitis A. Hepatitis B vaccine  You may need this if you have certain conditions or if you travel or work in places where you may be exposed to hepatitis B. Haemophilus influenzae type b (Hib) vaccine  You may need this if you have certain risk factors. Human papillomavirus (HPV) vaccine  If recommended by your health care provider, you may need three doses over 6 months. You may receive vaccines as individual doses or as more than one vaccine together in one shot (combination vaccines). Talk with your health care provider about the risks and benefits of combination vaccines. What tests do I need? Blood tests  Lipid and cholesterol levels. These may be checked every 5 years, or more frequently if you are over 32 years old.  Hepatitis C test.  Hepatitis  B test. Screening  Lung cancer screening. You may have this screening every year starting at age 73 if you have a 30-pack-year history of smoking and currently smoke or have quit within the past 15 years.  Prostate cancer screening. Recommendations will vary depending on your family history and other risks.  Colorectal cancer screening. All adults should have this screening starting at age 79 and continuing until  age 33. Your health care provider may recommend screening at age 49 if you are at increased risk. You will have tests every 1-10 years, depending on your results and the type of screening test.  Diabetes screening. This is done by checking your blood sugar (glucose) after you have not eaten for a while (fasting). You may have this done every 1-3 years.  Sexually transmitted disease (STD) testing. Follow these instructions at home: Eating and drinking  Eat a diet that includes fresh fruits and vegetables, whole grains, lean protein, and low-fat dairy products.  Take vitamin and mineral supplements as recommended by your health care provider.  Do not drink alcohol if your health care provider tells you not to drink.  If you drink alcohol: ? Limit how much you have to 0-2 drinks a day. ? Be aware of how much alcohol is in your drink. In the [place] one drink equals one 12 oz bottle of beer (355 mL), one 5 oz glass of wine (148 mL), or one 1 oz glass of hard liquor (44 mL). Lifestyle  Take daily care of your teeth and gums.  Stay active. Exercise for at least 30 minutes on 5 or more days each week.  Do not use any products that contain nicotine or tobacco, such as cigarettes, e-cigarettes, and chewing tobacco. If you need help quitting, ask your health care provider.  If you are sexually active, practice safe sex. Use a condom or other form of protection to prevent STIs (sexually transmitted infections).  Talk with your health care provider about taking a low-dose aspirin every day starting at age 16. What's next?  Go to your health care provider once a year for a well check visit.  Ask your health care provider how often you should have your eyes and teeth checked.  Stay up to date on all vaccines. This information is not intended to replace advice given to you by your health care provider. Make sure you discuss any questions you have with your health care provider. Document Revised:  09/17/2018 Document Reviewed: 09/17/2018 Elsevier Patient Education  2020 [employer].

## 2020-02-23 NOTE — Progress Notes (Signed)
Established Patient Office Visit  Subjective:  Patient ID: Adam Woodward, male    DOB: July 09, 1966  Age: 54 y.o. MRN: [id number]  CC:  Chief Complaint  Patient presents with  . Annual Exam    CPE   HPI Adam Woodward is a 54 year old with PMH of BPH and constipation presents for his complete physical. He has no specific concerns.   Constipation: He is doing well with Miralax and does not need it daily. No bleeding. He has spontaneous bowel movements. Positive Cologuard for CC screening. He underwent a complete colonoscopy on to the cecum on 01/19/2020 showing diverticulosis, no colon polyps, or explanation for Cologuard test results.  He does not see any blood or melena stool.  He has good bowel movements at this time and no abdominal pain.  He reports normal appetite, diet, and weight. False positive Cologuard:  colonoscopy for screening recommended in 2031.    BPH: He reports he was prescribed Flomax 0.4 mg many years ago.  He has urinary frequency.  He still gets up twice at night to void.  He has no trouble with his stream otherwise. He has not seen Urology.   Lab Results  Component Value Date   PSA 1.15 12/16/2019    Patient presents today for complete physical.   Immunizations:No Immunizations in [place]  TD: 10/30/2006 Pneumovax 23: none Shingles: none PFX:[id number] Covid- no and we dicussed the risks/benefits today.    Diet:Healthy Exercise:walking Colonoscopy:UTD Vision: 2018- no-recommended Dental: not UTD- recommended   Past Medical History:  Diagnosis Date  . Bowel obstruction ([place]) 2015  . BPH (benign prostatic hyperplasia)   . Constipation 12/16/2019  . Constipation     No past surgical history on file.  Family History  Problem Relation Age of Onset  . Heart Problems Mother   . Liver cancer Mother   . Breast cancer Mother   . Alcohol abuse Father     Social History   Socioeconomic History  . Marital status: Single    Spouse name: Not on file  .  Number of children: Not on file  . Years of education: Not on file  . Highest education level: Not on file  Occupational History  . Occupation: retired  Tobacco Use  . Smoking status: Never Smoker  . Smokeless tobacco: Never Used  Substance and Sexual Activity  . Alcohol use: No  . Drug use: No  . Sexual activity: Never  Other Topics Concern  . Not on file  Social History Narrative  . Not on file   Social Determinants of Health   Financial Resource Strain:   . Difficulty of Paying Living Expenses:   Food Insecurity:   . Worried About Charity fundraiser in the Last Year:   . Arboriculturist in the Last Year:   Transportation Needs:   . Film/video editor (Medical):   Marland Kitchen Lack of Transportation (Non-Medical):   Physical Activity:   . Days of Exercise per Week:   . Minutes of Exercise per Session:   Stress:   . Feeling of Stress :   Social Connections:   . Frequency of Communication with Friends and Family:   . Frequency of Social Gatherings with Friends and Family:   . Attends Religious Services:   . Active Member of Clubs or Organizations:   . Attends Archivist Meetings:   Marland Kitchen Marital Status:   Intimate Partner Violence:   . Fear of Current or Ex-Partner:   .  Emotionally Abused:   Marland Kitchen Physically Abused:   . Sexually Abused:     Outpatient Medications Prior to Visit  Medication Sig Dispense Refill  . polyethylene glycol (MIRALAX / GLYCOLAX) 17 g packet Take 17 g by mouth daily.    . tamsulosin (FLOMAX) 0.4 MG CAPS capsule Take 0.4 mg by mouth.    . docusate sodium (COLACE) 50 MG capsule Take 1 capsule (50 mg total) by mouth 2 (two) times daily. 10 capsule 0   No facility-administered medications prior to visit.    No Known Allergies  ROS Review of Systems  Constitutional: Negative for chills and fever.  HENT: Negative for congestion and sore throat.   Eyes: Negative.   Respiratory: Negative for cough and shortness of breath.   Cardiovascular:  Negative for chest pain and leg swelling.  Gastrointestinal: Negative for abdominal pain, blood in stool, constipation and rectal pain.  Endocrine: Negative for cold intolerance and heat intolerance.  Genitourinary: Negative for difficulty urinating, frequency and hematuria.       Positive nocturia x 2 per night -x 3 years. He was placed on Flomax in about 2014.   Musculoskeletal: Negative for back pain.  Skin: Negative for rash.  Allergic/Immunologic: Positive for environmental allergies. Negative for food allergies.       Pollen allergies in the past-none now.   Neurological: Negative for dizziness and headaches.  Hematological: Negative.   Psychiatric/Behavioral:       No concerns about depression or anxiety      Objective:    Physical Exam  Constitutional: He is oriented to person, place, and time. He appears well-developed and well-nourished.  HENT:  Head: Normocephalic.  Eyes: Pupils are equal, round, and reactive to light.  Cardiovascular: Normal rate, regular rhythm and normal heart sounds.  Pulmonary/Chest: Effort normal and breath sounds normal.  Abdominal: Soft. Bowel sounds are normal. There is no abdominal tenderness.  Musculoskeletal:        General: Normal range of motion.     Cervical back: Normal range of motion and neck supple.  Neurological: He is alert and oriented to person, place, and time.  Skin: Skin is warm and dry.  Psychiatric: He has a normal mood and affect. His behavior is normal.  Vitals reviewed.   BP 120/78 (BP Location: Left Arm, Patient Position: Sitting, Cuff Size: Small)   Pulse 70   Temp 97.8 F (36.6 C) (Skin)   Ht 6' (1.829 m)   Wt 184 lb (83.5 kg)   SpO2 98%   BMI 24.95 kg/m  Wt Readings from Last 3 Encounters:  02/23/20 184 lb (83.5 kg)  01/19/20 184 lb (83.5 kg)  01/14/20 184 lb 2 oz (83.5 kg)     Health Maintenance Due  Topic Date Due  . HIV Screening  Never done  . TETANUS/TDAP  10/30/2016    There are no preventive  care reminders to display for this patient.  Lab Results  Component Value Date   TSH 0.93 12/16/2019   Lab Results  Component Value Date   WBC 4.8 11/28/2019   HGB 13.3 11/28/2019   HCT 40.8 11/28/2019   MCV 83.8 11/28/2019   PLT 208 11/28/2019   Lab Results  Component Value Date   NA 140 11/28/2019   K 3.6 11/28/2019   CO2 29 11/28/2019   GLUCOSE 96 11/28/2019   BUN 14 11/28/2019   CREATININE 1.17 11/28/2019   BILITOT 0.9 11/28/2019   ALKPHOS 57 11/28/2019   AST 15 11/28/2019  ALT 15 11/28/2019   PROT 7.5 11/28/2019   ALBUMIN 4.3 11/28/2019   CALCIUM 9.1 11/28/2019   ANIONGAP 7 11/28/2019   No results found for: CHOL No results found for: HDL No results found for: LDLCALC No results found for: TRIG No results found for: CHOLHDL No results found for: HGBA1C    Assessment & Plan:   Problem List Items Addressed This Visit    None    Visit Diagnoses    Preventative health care    -  Primary   Relevant Orders   HIV antibody (with reflex)   Hepatitis C Antibody   Lipid Profile   HgB A1c    During the course of the visit the patient was educated and counseled about appropriate screening and preventive services. Nutrition counseling, along with review of the age appropriate recommended immunizations.  Printed recommendations for health maintenance screenings was given.  No orders of the defined types were placed in this encounter. I recommend getting the Covid vaccine.  To get the vaccine- call  [phone] to schedule an appointment or walk-in [address] [place], [place] [address]   You are due for many adult vaccines- but they can't be started until 3 months after your last Covid vaccine.   Please follow up in late Sept and we can start to catch you up on these adult vaccines.   Vision: 2018- recommend call eye doctor for exam  Dental: not UTD- recommend call dentis for teeth cleaning and check   To the lab today  Continue on the Flomax for your  prostate.I recommended cutting back on evening fluids. No problems with his stream and normal PSA. Monitor.    Continue on the Miralax for constipation as it is working well.   Follow-up: No follow-ups on file.   This visit occurred during the SARS-CoV-2 public health emergency.  Safety protocols were in place, including screening questions prior to the visit, additional usage of staff PPE, and extensive cleaning of exam room while observing appropriate contact time as indicated for disinfecting solutions.    Denice Paradise, NP

## 2020-02-23 NOTE — Assessment & Plan Note (Signed)
On Flomax 0.4 mg for many years- per patient- 2014. Nocturia x 2 . He has no difficulty with his stream and no concerns. Advised to cut back on evening fluids and caffeine. Normal PSA. Will monitor.

## 2020-02-23 NOTE — Assessment & Plan Note (Signed)
Doing well on Miralax

## 2020-02-24 LAB — HEPATITIS C ANTIBODY
Hepatitis C Ab: NONREACTIVE
SIGNAL TO CUT-OFF: 0.01 (ref <1.00)

## 2020-02-24 LAB — HIV ANTIBODY (ROUTINE TESTING W REFLEX): HIV 1&2 Ab, 4th Generation: NONREACTIVE

## 2020-10-07 ENCOUNTER — Emergency Department
Admission: EM | Admit: 2020-10-07 | Discharge: 2020-10-07 | Disposition: A | Discharge status: Home / Self Care | Payer: BC Managed Care – PPO | Attending: Emergency Medicine | Admitting: Emergency Medicine

## 2020-10-07 ENCOUNTER — Emergency Department: Payer: BC Managed Care – PPO

## 2020-10-07 ENCOUNTER — Other Ambulatory Visit: Payer: Self-pay

## 2020-10-07 DIAGNOSIS — U071 COVID-19: Secondary | ICD-10-CM | POA: Diagnosis not present

## 2020-10-07 DIAGNOSIS — K5900 Constipation, unspecified: Secondary | ICD-10-CM | POA: Diagnosis not present

## 2020-10-07 DIAGNOSIS — R0602 Shortness of breath: Secondary | ICD-10-CM | POA: Diagnosis present

## 2020-10-07 LAB — BASIC METABOLIC PANEL
Anion gap: 8 (ref 5–15)
BUN: 15 mg/dL (ref 6–20)
CO2: 27 mmol/L (ref 22–32)
Calcium: 9.1 mg/dL (ref 8.9–10.3)
Chloride: 104 mmol/L (ref 98–111)
Creatinine, Ser: 1.22 mg/dL (ref 0.61–1.24)
GFR, Estimated: >60 mL/min (ref >60)
Glucose, Bld: 101 mg/dL — ABNORMAL HIGH (ref 70–99)
Potassium: 4.1 mmol/L (ref 3.5–5.1)
Sodium: 139 mmol/L (ref 135–145)

## 2020-10-07 LAB — CBC
HCT: 40.5 % (ref 39.0–52.0)
Hemoglobin: 13.6 g/dL (ref 13.0–17.0)
MCH: 28 pg (ref 26.0–34.0)
MCHC: 33.6 g/dL (ref 30.0–36.0)
MCV: 83.5 fL (ref 80.0–100.0)
Platelets: 207 10*3/uL (ref 150–400)
RBC: 4.85 MIL/uL (ref 4.22–5.81)
RDW: 13.8 % (ref 11.5–15.5)
WBC: 6.5 10*3/uL (ref 4.0–10.5)
nRBC: 0 % (ref 0.0–0.2)

## 2020-10-07 LAB — POC SARS CORONAVIRUS 2 AG -  ED: SARS Coronavirus 2 Ag: POSITIVE — AB

## 2020-10-07 LAB — TROPONIN I (HIGH SENSITIVITY)
Troponin I (High Sensitivity): 2 ng/L (ref <18)
Troponin I (High Sensitivity): 2 ng/L (ref <18)

## 2020-10-07 MED ORDER — SENNOSIDES-DOCUSATE SODIUM 8.6-50 MG PO TABS: 2.0000 | ORAL_TABLET | Freq: Two times a day (BID) | ORAL | 0 refills | Status: AC

## 2020-10-07 MED ORDER — BISACODYL 10 MG RE SUPP: 10.0000 mg | RECTAL | 0 refills | Status: AC | PRN

## 2020-10-07 MED ORDER — MAGNESIUM CITRATE PO SOLN: 1.0000 | Freq: Once | ORAL | 0 refills | Status: AC

## 2020-10-07 NOTE — ED Triage Notes (Addendum)
Pt thinks he got covid from someone. SOB and thinks there has been blood in his stool x1 episode.

## 2020-10-07 NOTE — ED Notes (Signed)
Report to ashton and emily, rn.  

## 2020-10-07 NOTE — ED Provider Notes (Signed)
[facility] Emergency Department Provider Note  ____________________________________________  Time seen: Approximately 11:33 PM  I have reviewed the triage vital signs and the nursing notes.   HISTORY  Chief Complaint Shortness of Breath    HPI Adam Woodward is a 55 y.o. male with a history of BPH and constipation who comes ED complaining of constipation for the past week with hard stools and straining.  Also reports Covid exposure from a week ago.  Denies chest pain shortness of breath fevers or chills, no vomiting.  Eating normally.  He does have some mild body aches and fatigue.  He had 1 dose of the [employer] Covid vaccine.  Constipation symptoms are constant, no aggravating or alleviating factors.  He has tried MiraLAX without relief.  Denies abdominal pain.      Past Medical History:  Diagnosis Date  . Bowel obstruction (HCC) 2015  . BPH (benign prostatic hyperplasia)   . Constipation 12/16/2019  . Constipation      Patient Active Problem List   Diagnosis Date Noted  . Hx of colonoscopy 01/19/2020  . BPH (benign prostatic hyperplasia)   . Constipation 12/16/2019     Past Surgical History:  Procedure Laterality Date  . COLONOSCOPY  01/19/2020   False positive Cologuard. Diverticulosis. Repeat screening colonscopy in 10 years-2031.      Prior to Admission medications   Medication Sig Start Date End Date Taking? Authorizing Provider  bisacodyl (DULCOLAX) 10 MG suppository Place 1 suppository (10 mg total) rectally as needed for moderate constipation. 10/07/20  Yes Sharman Cheek, MD  magnesium citrate SOLN Take 296 mLs (1 Bottle total) by mouth once for 1 dose. 10/07/20 10/07/20 Yes Sharman Cheek, MD  senna-docusate (SENOKOT-S) 8.6-50 MG tablet Take 2 tablets by mouth 2 (two) times daily. 10/07/20  Yes Sharman Cheek, MD  polyethylene glycol (MIRALAX / GLYCOLAX) 17 g packet Take 17 g by mouth daily.    [provider]   tamsulosin (FLOMAX) 0.4 MG CAPS capsule Take 0.4 mg by mouth.    [provider]     Allergies Patient has no known allergies.   Family History  Problem Relation Age of Onset  . Heart Problems Mother   . Liver cancer Mother   . Breast cancer Mother   . Alcohol abuse Father     Social History Social History   Tobacco Use  . Smoking status: Never Smoker  . Smokeless tobacco: Never Used  Vaping Use  . Vaping Use: Never used  Substance Use Topics  . Alcohol use: No  . Drug use: No    Review of Systems  Constitutional:   No fever or chills.  ENT:   No sore throat. No rhinorrhea. Cardiovascular:   No chest pain or syncope. Respiratory:   No dyspnea or cough. Gastrointestinal:   Negative for abdominal pain, vomiting and diarrhea.  Positive constipation Musculoskeletal:   Negative for focal pain or swelling All other systems reviewed and are negative except as documented above in ROS and HPI.  ____________________________________________   PHYSICAL EXAM:  VITAL SIGNS: ED Triage Vitals  Enc Vitals Group     BP 10/07/20 1652 131/82     Pulse Rate 10/07/20 1652 77     Resp 10/07/20 1652 18     Temp 10/07/20 1652 98.2 F (36.8 C)     Temp Source 10/07/20 1652 Oral     SpO2 10/07/20 1652 100 %     Weight 10/07/20 1650 193 lb (87.5 kg)  Height 10/07/20 1650 6\' 1"  (1.854 m)     Head Circumference --      Peak Flow --      Pain Score 10/07/20 1650 3     Pain Loc --      Pain Edu? --      Excl. in [place]? --     Vital signs reviewed, nursing assessments reviewed.   Constitutional:   Alert and oriented. Non-toxic appearance. Eyes:   Conjunctivae are normal. EOMI. PERRL. ENT      Head:   Normocephalic and atraumatic.      Nose:   Wearing a mask.      Mouth/Throat:   Wearing a mask.      Neck:   No meningismus. Full ROM. Hematological/Lymphatic/Immunilogical:   No cervical lymphadenopathy. Cardiovascular:   RRR. Symmetric bilateral radial and DP  pulses.  No murmurs. Cap refill less than 2 seconds. Respiratory:   Normal respiratory effort without tachypnea/retractions. Breath sounds are clear and equal bilaterally. No wheezes/rales/rhonchi. Gastrointestinal:   Soft and nontender. Non distended.  No rebound, rigidity, or guarding.  Musculoskeletal:   Normal range of motion in all extremities. No joint effusions.  No lower extremity tenderness.  No edema. Neurologic:   Normal speech and language.  Motor grossly intact. No acute focal neurologic deficits are appreciated.  Skin:    Skin is warm, dry and intact. No rash noted.  No petechiae, purpura, or bullae.  ____________________________________________    LABS (pertinent positives/negatives) (all labs ordered are listed, but only abnormal results are displayed) Labs Reviewed  BASIC METABOLIC PANEL - Abnormal; Notable for the following components:      Result Value   Glucose, Bld 101 (*)    All other components within normal limits  POC SARS CORONAVIRUS 2 AG -  ED - Abnormal; Notable for the following components:   SARS Coronavirus 2 Ag POSITIVE (*)    All other components within normal limits  CBC  TROPONIN I (HIGH SENSITIVITY)  TROPONIN I (HIGH SENSITIVITY)   ____________________________________________   EKG  Interpreted by me Normal sinus rhythm rate of 73, normal axis and intervals.  Poor R wave progression.  Normal ST segments and T waves.  No acute ischemic changes.  ____________________________________________    RADIOLOGY  DG Chest 2 View  Result Date: 10/07/2020 CLINICAL DATA:  Shortness of breath and blood in stool. Nonsmoker. Possible COVID EXAM: CHEST - 2 VIEW COMPARISON:  None. FINDINGS: Normal heart size and pulmonary vascularity. Hyperinflation of the lungs, possibly emphysema. No airspace disease or consolidation. No pleural effusions. No pneumothorax. Mediastinal contours appear intact. Mild degenerative changes in the spine. IMPRESSION: No active  cardiopulmonary disease. Electronically Signed   By: Lucienne Capers M.D.   On: 10/07/2020 17:17    ____________________________________________   PROCEDURES Procedures  ____________________________________________    CLINICAL IMPRESSION / ASSESSMENT AND PLAN / ED COURSE  Medications ordered in the ED: Medications - No data to display  Pertinent labs & imaging results that were available during my care of the patient were reviewed by me and considered in my medical decision making (see chart for details).  Adam Woodward was evaluated in Emergency Department on 10/07/2020 for the symptoms described in the history of present illness. He was evaluated in the context of the global COVID-19 pandemic, which necessitated consideration that the patient might be at risk for infection with the SARS-CoV-2 virus that causes COVID-19. Institutional protocols and algorithms that pertain to the evaluation of patients at risk for  COVID-19 are in a state of rapid change based on information released by regulatory bodies including the CDC and federal and state organizations. These policies and algorithms were followed during the patient's care in the ED.   Patient presents with complaints of constipation and worried about Covid exposure.  Covid rapid antigen is positive.  Vital signs are normal, oxygenation is normal, exam is normal.  Chest x-ray image reviewed and interpreted by me, which is unremarkable, clear lungs.  Radiology report agrees.  Labs are all normal.  Stable for outpatient follow-up for Covid.  For constipation, will add on Dulcolax suppository, magnesium citrate, Senokot, after he has a suitable bowel movement he can resume with MiraLAX.  Doubt bowel obstruction.      ____________________________________________   FINAL CLINICAL IMPRESSION(S) / ED DIAGNOSES    Final diagnoses:  COVID-19 virus infection  Constipation, unspecified constipation type     ED Discharge Orders          Ordered    bisacodyl (DULCOLAX) 10 MG suppository  As needed        10/07/20 2333    magnesium citrate SOLN   Once        10/07/20 2333    senna-docusate (SENOKOT-S) 8.6-50 MG tablet  2 times daily        10/07/20 2333          Portions of this note were generated with dragon dictation software. Dictation errors may occur despite best attempts at proofreading.   Sharman Cheek, MD 10/07/20 2342

## 2020-10-07 NOTE — ED Notes (Signed)
Pt's O2 sats maintained >96% while ambulating.

## 2020-10-16 ENCOUNTER — Encounter: Payer: Self-pay | Admitting: Emergency Medicine

## 2020-10-16 ENCOUNTER — Emergency Department: Payer: BC Managed Care – PPO

## 2020-10-16 ENCOUNTER — Other Ambulatory Visit: Payer: Self-pay

## 2020-10-16 ENCOUNTER — Emergency Department
Admission: EM | Admit: 2020-10-16 | Discharge: 2020-10-16 | Disposition: A | Discharge status: Home / Self Care | Payer: BC Managed Care – PPO | Attending: Emergency Medicine | Admitting: Emergency Medicine

## 2020-10-16 DIAGNOSIS — R059 Cough, unspecified: Secondary | ICD-10-CM | POA: Diagnosis present

## 2020-10-16 DIAGNOSIS — K5904 Chronic idiopathic constipation: Secondary | ICD-10-CM | POA: Diagnosis not present

## 2020-10-16 DIAGNOSIS — U071 COVID-19: Secondary | ICD-10-CM | POA: Diagnosis not present

## 2020-10-16 LAB — URINALYSIS, ROUTINE W REFLEX MICROSCOPIC
Bilirubin Urine: NEGATIVE
Glucose, UA: NEGATIVE mg/dL
Hgb urine dipstick: NEGATIVE
Ketones, ur: 20 mg/dL — AB
Leukocytes,Ua: NEGATIVE
Nitrite: NEGATIVE
Protein, ur: NEGATIVE mg/dL
Specific Gravity, Urine: 1.019 (ref 1.005–1.030)
pH: 6 (ref 5.0–8.0)

## 2020-10-16 MED ORDER — MINERAL OIL RE ENEM
1.0000 | ENEMA | Freq: Once | RECTAL | Status: AC
  Administered 2020-10-16: 1 via RECTAL

## 2020-10-16 MED ORDER — FLEET ENEMA 7-19 GM/118ML RE ENEM: 1.0000 | ENEMA | Freq: Once | RECTAL | Status: DC

## 2020-10-16 MED ORDER — DOCUSATE SODIUM 100 MG PO CAPS: 100.0000 mg | ORAL_CAPSULE | Freq: Every day | ORAL | 0 refills | Status: AC

## 2020-10-16 MED ORDER — LACTULOSE 10 GM/15ML PO SOLN: 20.0000 g | Freq: Two times a day (BID) | ORAL | 0 refills | Status: AC | PRN

## 2020-10-16 NOTE — ED Triage Notes (Signed)
Pt states straining to go to the bathroom.  Pt states last BM on Friday.  Pt also states straining to urinate.  Pt was diagnosed with Covid on 10/07/2020.  Pt denies Covid symptoms at this time.

## 2020-10-16 NOTE — ED Provider Notes (Signed)
[facility] Emergency Department Provider Note  ____________________________________________   Event Date/Time   First MD Initiated Contact with Patient 10/16/20 1711     (approximate)  I have reviewed the triage vital signs and the nursing notes.   HISTORY  Chief Complaint Constipation    HPI Adam Woodward is a 55 y.o. male  With h/o constipation, BPH, here with constipation. Pt reports that his last BM was on Friday. He states it was a small amount of firm, brown stool. Since then, he has been essentially unable to pass any stool. He's had associated mild abdominal cramping when he attempts to have a BM. He has had associated feeling of pressure and fullness in his rectal area when he has a BM. He feels like something is "blocking" when he tries to go. He has had similar sx in the past and had to have enemas. He tried to give himself an enema without much success. No blood in his stool. No nausea, vomiting. No CP. Of note he was diagnosed with COVID 2 weeks ago - he had a mild cough but no SOB, and sx now resolved. No diarrhea with COVID.       Past Medical History:  Diagnosis Date  . Bowel obstruction (HCC) 2015  . BPH (benign prostatic hyperplasia)   . Constipation 12/16/2019  . Constipation     Patient Active Problem List   Diagnosis Date Noted  . Hx of colonoscopy 01/19/2020  . BPH (benign prostatic hyperplasia)   . Constipation 12/16/2019    Past Surgical History:  Procedure Laterality Date  . COLONOSCOPY  01/19/2020   False positive Cologuard. Diverticulosis. Repeat screening colonscopy in 10 years-2031.     Prior to Admission medications   Medication Sig Start Date End Date Taking? Authorizing Provider  docusate sodium (COLACE) 100 MG capsule Take 1 capsule (100 mg total) by mouth daily. 10/16/20 11/15/20 Yes Shaune Pollack, MD  lactulose (CHRONULAC) 10 GM/15ML solution Take 30 mLs (20 g total) by mouth 2 (two) times daily as needed for  moderate constipation or severe constipation. 10/16/20  Yes Shaune Pollack, MD  bisacodyl (DULCOLAX) 10 MG suppository Place 1 suppository (10 mg total) rectally as needed for moderate constipation. 10/07/20   Sharman Cheek, MD  polyethylene glycol (MIRALAX / GLYCOLAX) 17 g packet Take 17 g by mouth daily.    [provider]  senna-docusate (SENOKOT-S) 8.6-50 MG tablet Take 2 tablets by mouth 2 (two) times daily. 10/07/20   Sharman Cheek, MD  tamsulosin (FLOMAX) 0.4 MG CAPS capsule Take 0.4 mg by mouth.    [provider]    Allergies Patient has no known allergies.  Family History  Problem Relation Age of Onset  . Heart Problems Mother   . Liver cancer Mother   . Breast cancer Mother   . Alcohol abuse Father     Social History Social History   Tobacco Use  . Smoking status: Never Smoker  . Smokeless tobacco: Never Used  Vaping Use  . Vaping Use: Never used  Substance Use Topics  . Alcohol use: No  . Drug use: No    Review of Systems  Review of Systems  Constitutional: Negative for chills, fatigue and fever.  HENT: Negative for sore throat.   Respiratory: Negative for shortness of breath.   Cardiovascular: Negative for chest pain.  Gastrointestinal: Positive for abdominal pain and constipation.  Genitourinary: Negative for flank pain.  Musculoskeletal: Negative for neck pain.  Skin: Negative for rash  and wound.  Allergic/Immunologic: Negative for immunocompromised state.  Neurological: Negative for weakness and numbness.  Hematological: Does not bruise/bleed easily.  All other systems reviewed and are negative.    ____________________________________________  PHYSICAL EXAM:      VITAL SIGNS: ED Triage Vitals  Enc Vitals Group     BP 10/16/20 1517 (!) 146/84     Pulse Rate 10/16/20 1517 83     Resp 10/16/20 1517 18     Temp 10/16/20 1517 98.1 F (36.7 C)     Temp Source 10/16/20 1517 Oral     SpO2 10/16/20 1517 100 %     Weight  10/16/20 1518 192 lb 15.9 oz (87.5 kg)     Height 10/16/20 1518 6\' 1"  (1.854 m)     Head Circumference --      Peak Flow --      Pain Score 10/16/20 1518 0     Pain Loc --      Pain Edu? --      Excl. in [place]? --      Physical Exam Vitals and nursing note reviewed.  Constitutional:      General: He is not in acute distress.    Appearance: He is well-developed and well-nourished.  HENT:     Head: Normocephalic and atraumatic.  Eyes:     Conjunctiva/sclera: Conjunctivae normal.  Cardiovascular:     Rate and Rhythm: Normal rate and regular rhythm.     Heart sounds: Normal heart sounds.  Pulmonary:     Effort: Pulmonary effort is normal. No respiratory distress.     Breath sounds: No wheezing.  Abdominal:     General: Abdomen is flat. There is no distension.     Tenderness: There is no abdominal tenderness. There is no guarding or rebound.  Genitourinary:    Comments: Rectal tone normal. Small non thrombosed internal hemorrhoids appreciated, without bleeding. No fecal impaction noted clinically. Musculoskeletal:        General: No edema.     Cervical back: Neck supple.  Skin:    General: Skin is warm.     Capillary Refill: Capillary refill takes less than 2 seconds.     Findings: No rash.  Neurological:     Mental Status: He is alert and oriented to person, place, and time.     Motor: No abnormal muscle tone.       ____________________________________________   LABS (all labs ordered are listed, but only abnormal results are displayed)  Labs Reviewed  URINALYSIS, ROUTINE W REFLEX MICROSCOPIC - Abnormal; Notable for the following components:      Result Value   Color, Urine YELLOW (*)    APPearance CLEAR (*)    Ketones, ur 20 (*)    All other components within normal limits  SARS CORONAVIRUS 2 (TAT 6-24 HRS)    ____________________________________________  EKG:  ________________________________________  RADIOLOGY All imaging, including plain films, CT scans,  and ultrasounds, independently reviewed by me, and interpretations confirmed via formal radiology reads.  ED MD interpretation:   KUB: Negative  Official radiology report(s): DG Abdomen 1 View  Result Date: 10/16/2020 CLINICAL DATA:  55 year old male with constipation EXAM: ABDOMEN - 1 VIEW COMPARISON:  Abdominal radiograph dated 11/28/2019. FINDINGS: The bowel gas pattern is normal. No radio-opaque calculi or other significant radiographic abnormality are seen. IMPRESSION: Negative. Electronically Signed   By: 11/30/2019 M.D.   On: 10/16/2020 18:06    ____________________________________________  PROCEDURES   Procedure(s) performed (including Critical Care):  Procedures  ____________________________________________  INITIAL IMPRESSION / MDM / ASSESSMENT AND PLAN / ED COURSE  As part of my medical decision making, I reviewed the following data within the electronic MEDICAL RECORD NUMBER Nursing notes reviewed and incorporated, Old chart reviewed, Notes from prior ED visits, and [place] Controlled Substance Database       *Payne Garske was evaluated in Emergency Department on 10/16/2020 for the symptoms described in the history of present illness. He was evaluated in the context of the global COVID-19 pandemic, which necessitated consideration that the patient might be at risk for infection with the SARS-CoV-2 virus that causes COVID-19. Institutional protocols and algorithms that pertain to the evaluation of patients at risk for COVID-19 are in a state of rapid change based on information released by regulatory bodies including the CDC and federal and state organizations. These policies and algorithms were followed during the patient's care in the ED.  Some ED evaluations and interventions may be delayed as a result of limited staffing during the pandemic.*     Medical Decision Making:  Very pleasant 55 yo M here with reported constipation. Pt has long h/o constipation with multiple prior  ed visits for same. Pt is afebrile, nontoxic on exam. No significant abd TTP. Rectal exam unremarkable. He has mild ketonuria noted but no signs of UTI, hematuria, or retention. KUB is non obstructive. Suspect recurrent constipation vs impaction. Enema given here w/ good effect. No fever, abnormal VS, or focal TTP to suggest obstruction, diverticulitis, or other emergent pathology. Will d/c with outpt bowel regimen, lactulose PRN.  ____________________________________________  FINAL CLINICAL IMPRESSION(S) / ED DIAGNOSES  Final diagnoses:  Chronic idiopathic constipation     MEDICATIONS GIVEN DURING THIS VISIT:  Medications  mineral oil enema 1 enema (1 enema Rectal Given 10/16/20 1747)     ED Discharge Orders         Ordered    docusate sodium (COLACE) 100 MG capsule  Daily        10/16/20 1758    lactulose (CHRONULAC) 10 GM/15ML solution  2 times daily PRN        10/16/20 1758           Note:  This document was prepared using Dragon voice recognition software and may include unintentional dictation errors.   Shaune Pollack, MD 10/16/20 [phone]

## 2020-10-16 NOTE — Discharge Instructions (Addendum)
Take the lactulose liquid up to three times a day for constipation. Do not take for more than 2 days at a time.  Drink at least 6-8 glasses of water daily.  Take a stool softener daily. I have prescribed some but these can also be purchased over-the-counter.  Try to eat a diet high in fiber.

## 2020-10-17 LAB — SARS CORONAVIRUS 2 (TAT 6-24 HRS): SARS Coronavirus 2: POSITIVE — AB

## 2020-10-19 ENCOUNTER — Encounter: Payer: Self-pay | Admitting: *Deleted

## 2020-10-19 ENCOUNTER — Other Ambulatory Visit: Payer: Self-pay

## 2020-10-19 ENCOUNTER — Emergency Department
Admission: EM | Admit: 2020-10-19 | Discharge: 2020-10-19 | Disposition: A | Discharge status: Home / Self Care | Payer: BC Managed Care – PPO | Attending: Emergency Medicine | Admitting: Emergency Medicine

## 2020-10-19 ENCOUNTER — Emergency Department: Payer: BC Managed Care – PPO

## 2020-10-19 DIAGNOSIS — K5909 Other constipation: Secondary | ICD-10-CM | POA: Diagnosis not present

## 2020-10-19 DIAGNOSIS — Z79899 Other long term (current) drug therapy: Secondary | ICD-10-CM | POA: Diagnosis not present

## 2020-10-19 DIAGNOSIS — R109 Unspecified abdominal pain: Secondary | ICD-10-CM | POA: Diagnosis present

## 2020-10-19 DIAGNOSIS — K602 Anal fissure, unspecified: Secondary | ICD-10-CM | POA: Insufficient documentation

## 2020-10-19 DIAGNOSIS — R1110 Vomiting, unspecified: Secondary | ICD-10-CM | POA: Diagnosis not present

## 2020-10-19 LAB — URINALYSIS, COMPLETE (UACMP) WITH MICROSCOPIC
Bacteria, UA: NONE SEEN
Bilirubin Urine: NEGATIVE
Glucose, UA: NEGATIVE mg/dL
Hgb urine dipstick: NEGATIVE
Ketones, ur: NEGATIVE mg/dL
Leukocytes,Ua: NEGATIVE
Nitrite: NEGATIVE
Protein, ur: NEGATIVE mg/dL
Specific Gravity, Urine: 1.005 (ref 1.005–1.030)
Squamous Epithelial / HPF: NONE SEEN (ref 0–5)
pH: 6 (ref 5.0–8.0)

## 2020-10-19 LAB — COMPREHENSIVE METABOLIC PANEL
ALT: 18 U/L (ref 0–44)
AST: 19 U/L (ref 15–41)
Albumin: 4.8 g/dL (ref 3.5–5.0)
Alkaline Phosphatase: 59 U/L (ref 38–126)
Anion gap: 12 (ref 5–15)
BUN: 8 mg/dL (ref 6–20)
CO2: 25 mmol/L (ref 22–32)
Calcium: 9.9 mg/dL (ref 8.9–10.3)
Chloride: 97 mmol/L — ABNORMAL LOW (ref 98–111)
Creatinine, Ser: 1.31 mg/dL — ABNORMAL HIGH (ref 0.61–1.24)
GFR, Estimated: >60 mL/min (ref >60)
Glucose, Bld: 119 mg/dL — ABNORMAL HIGH (ref 70–99)
Potassium: 4 mmol/L (ref 3.5–5.1)
Sodium: 134 mmol/L — ABNORMAL LOW (ref 135–145)
Total Bilirubin: 1 mg/dL (ref 0.3–1.2)
Total Protein: 8.3 g/dL — ABNORMAL HIGH (ref 6.5–8.1)

## 2020-10-19 LAB — LIPASE, BLOOD: Lipase: 31 U/L (ref 11–51)

## 2020-10-19 LAB — CBC
HCT: 43.8 % (ref 39.0–52.0)
Hemoglobin: 14.4 g/dL (ref 13.0–17.0)
MCH: 27.3 pg (ref 26.0–34.0)
MCHC: 32.9 g/dL (ref 30.0–36.0)
MCV: 83.1 fL (ref 80.0–100.0)
Platelets: 271 10*3/uL (ref 150–400)
RBC: 5.27 MIL/uL (ref 4.22–5.81)
RDW: 13.5 % (ref 11.5–15.5)
WBC: 6.7 10*3/uL (ref 4.0–10.5)
nRBC: 0 % (ref 0.0–0.2)

## 2020-10-19 MED ORDER — ONDANSETRON 4 MG PO TBDP
4.0000 mg | ORAL_TABLET | Freq: Once | ORAL | Status: DC
  Filled 2020-10-19: qty 1

## 2020-10-19 NOTE — ED Provider Notes (Signed)
[facility] Emergency Department Provider Note   ____________________________________________   Event Date/Time   First MD Initiated Contact with Patient 10/19/20 0745     (approximate)  I have reviewed the triage vital signs and the nursing notes.   HISTORY  Chief Complaint Emesis and Shortness of Breath    HPI Adam Woodward is a 54 y.o. male with past medical history of constipation and BPH who presents to the ED complaining of nausea and vomiting.  Patient reports that he had sudden onset of nausea with 1 episode of vomiting a couple hours prior to arrival.  This was associated with some abdominal pain, which has since resolved.  He primarily complains of burning in his throat at this time.  He also states that "it came loose" and points to towards an area in his groin.  He states something is torn in his groin and he believes it is at the base of his scrotum.  He denies any recent trauma to this area, reports some pain but no bleeding.  He endorses dysuria but denies any hematuria.  He also reports ongoing constipation, which is a chronic issue for him and he was seen in the ED for 3 days ago.  He was prescribed Colace and Dulcolax at that time, but reports no relief.  He denies any chest pain or shortness of breath with this episode.  Patient reportedly tested positive for COVID-19 12 days ago, but reports any cough or other symptoms associated with this have since resolved.        Past Medical History:  Diagnosis Date  . Bowel obstruction (HCC) 2015  . BPH (benign prostatic hyperplasia)   . Constipation 12/16/2019  . Constipation     Patient Active Problem List   Diagnosis Date Noted  . Hx of colonoscopy 01/19/2020  . BPH (benign prostatic hyperplasia)   . Constipation 12/16/2019    Past Surgical History:  Procedure Laterality Date  . COLONOSCOPY  01/19/2020   False positive Cologuard. Diverticulosis. Repeat screening colonscopy in 10  years-2031.     Prior to Admission medications   Medication Sig Start Date End Date Taking? Authorizing Provider  bisacodyl (DULCOLAX) 10 MG suppository Place 1 suppository (10 mg total) rectally as needed for moderate constipation. 10/07/20   Sharman Cheek, MD  docusate sodium (COLACE) 100 MG capsule Take 1 capsule (100 mg total) by mouth daily. 10/16/20 11/15/20  Shaune Pollack, MD  lactulose (CHRONULAC) 10 GM/15ML solution Take 30 mLs (20 g total) by mouth 2 (two) times daily as needed for moderate constipation or severe constipation. 10/16/20   Shaune Pollack, MD  polyethylene glycol (MIRALAX / GLYCOLAX) 17 g packet Take 17 g by mouth daily.    [provider]  senna-docusate (SENOKOT-S) 8.6-50 MG tablet Take 2 tablets by mouth 2 (two) times daily. 10/07/20   Sharman Cheek, MD  tamsulosin (FLOMAX) 0.4 MG CAPS capsule Take 0.4 mg by mouth.    [provider]    Allergies Patient has no known allergies.  Family History  Problem Relation Age of Onset  . Heart Problems Mother   . Liver cancer Mother   . Breast cancer Mother   . Alcohol abuse Father     Social History Social History   Tobacco Use  . Smoking status: Never Smoker  . Smokeless tobacco: Never Used  Vaping Use  . Vaping Use: Never used  Substance Use Topics  . Alcohol use: No  . Drug use: No  Review of Systems  Constitutional: No fever/chills Eyes: No visual changes. ENT: No sore throat. Cardiovascular: Denies chest pain. Respiratory: Denies shortness of breath. Gastrointestinal: No abdominal pain.  Positive for nausea and vomiting.  No diarrhea.  Positive for constipation. Genitourinary: Positive for dysuria. Musculoskeletal: Negative for back pain. Skin: Negative for rash. Neurological: Negative for headaches, focal weakness or numbness.  ____________________________________________   PHYSICAL EXAM:  VITAL SIGNS: ED Triage Vitals  Enc Vitals Group     BP 10/19/20 0302 (!)  156/93     Pulse Rate 10/19/20 0302 99     Resp 10/19/20 0302 16     Temp 10/19/20 0302 97.8 F (36.6 C)     Temp Source 10/19/20 0302 Oral     SpO2 10/19/20 0302 99 %     Weight 10/19/20 0304 192 lb 14.4 oz (87.5 kg)     Height 10/19/20 0304 6\' 1"  (1.854 m)     Head Circumference --      Peak Flow --      Pain Score 10/19/20 0303 8     Pain Loc --      Pain Edu? --      Excl. in GC? --     Constitutional: Alert and oriented. Eyes: Conjunctivae are normal. Head: Atraumatic. Nose: No congestion/rhinnorhea. Mouth/Throat: Mucous membranes are moist. Neck: Normal ROM Cardiovascular: Normal rate, regular rhythm. Grossly normal heart sounds. Respiratory: Normal respiratory effort.  No retractions. Lungs CTAB. Gastrointestinal: Soft and nontender. No distention.  Small anal fissure noted without any bleeding. Genitourinary: Scrotum intact with no wounds, erythema, edema, or tenderness. Musculoskeletal: No lower extremity tenderness nor edema. Neurologic:  Normal speech and language. No gross focal neurologic deficits are appreciated. Skin:  Skin is warm, dry and intact. No rash noted. Psychiatric: Mood and affect are normal. Speech and behavior are normal.  ____________________________________________   LABS (all labs ordered are listed, but only abnormal results are displayed)  Labs Reviewed  COMPREHENSIVE METABOLIC PANEL - Abnormal; Notable for the following components:      Result Value   Sodium 134 (*)    Chloride 97 (*)    Glucose, Bld 119 (*)    Creatinine, Ser 1.31 (*)    Total Protein 8.3 (*)    All other components within normal limits  URINALYSIS, COMPLETE (UACMP) WITH MICROSCOPIC - Abnormal; Notable for the following components:   Color, Urine YELLOW (*)    APPearance CLEAR (*)    All other components within normal limits  LIPASE, BLOOD  CBC   ____________________________________________  EKG  ED ECG REPORT I, 10/21/20, the attending physician,  personally viewed and interpreted this ECG.   Date: 10/19/2020  EKG Time: 2:55  Rate: 96  Rhythm: normal sinus rhythm  Axis: RAD  Intervals:none  ST&T Change: None   PROCEDURES  Procedure(s) performed (including Critical Care):  Procedures   ____________________________________________   INITIAL IMPRESSION / ASSESSMENT AND PLAN / ED COURSE      55 year old male with past medical history of chronic constipation and BPH who presents to the ED complaining of episode of nausea and vomiting along with a sensation of a tear in his groin.  He reports some ongoing nausea, but denies any abdominal pain and has no focal tenderness on exam.  Labs are reassuring, creatinine similar to previous.  EKG shows no evidence of arrhythmia or ischemia.  Patient is insistent that there is "a tear" in his groin area but his scrotum and penis are completely normal on  examination with no signs of infection or hernia.  He does have a small anal fissure, likely related to straining for bowel movements.  We will treat with Zofran and refer to GI for chronic constipation, patient also offered prescription for Anusol suppositories, but he declines.  UA is unremarkable and patient is appropriate for discharge home with GI follow-up.  He was counseled to return to the ED for new or worsening symptoms, patient agrees with plan.      ____________________________________________   FINAL CLINICAL IMPRESSION(S) / ED DIAGNOSES  Final diagnoses:  Anal fissure  Chronic constipation     ED Discharge Orders    None       Note:  This document was prepared using Dragon voice recognition software and may include unintentional dictation errors.   Chesley Noon, MD 10/19/20 1016

## 2020-10-19 NOTE — ED Notes (Signed)
This RN went in with EDP to evaluate pt. Pt trying to describe how he feel something "is loose" between his scrotum and rectum. EDP found fissure to which pt was not agreeable with diagnosis. After EDP left room, pt stated "we don't know what's wrong with him because he is dead." EDP made aware.

## 2020-10-19 NOTE — ED Triage Notes (Signed)
Pt to triage via w/c with no distress noted; EMS brought pt in from home for c/o N/V and abd pain; +COVID

## 2020-10-19 NOTE — ED Triage Notes (Addendum)
Pt to ED reporting sudden onset of nausea and vomiting this evening with SOB and dizziness. Continued abd pain upon arrival. Pt recently tested positive for COVID on 10/07/20.   Pt stated "it came loose" when asked to elaborate pt stated, "evidently it pulled loose from my scrotum a while ago but I didn't know it." RN continued to voice confusion and asked if pt meant his penis or his rectum and pt stated his rectum. Pt then denies any external tubes or drains, rectal bleeding, hemorrhoids, diarrhea or tearing of the skin. RN continued to verbalize confusion on what pt meant by "it came loose" and pt became frustrated and stated, "I guess from all the straining it just came loose" Pt frustrated with RN and unable to elaborate further but states this has been a problem for longer than he knew but he discovered it tonight due to the vomiting.

## 2020-10-24 ENCOUNTER — Telehealth: Payer: Self-pay

## 2020-10-24 NOTE — Telephone Encounter (Signed)
Patient was released form [facility], and per sister is still confused one moment to the nexr and he is stable one minute and next minute he does not know who he is or where he is at. Sister says he will just start talking out of his head about seeing dead people advised patient really should return to ER, Sis ter says she will take him to the er for re-evaluation of mental status. Also advised Fransico Setters NP no longer in office and he would need a new PCP. She agreed.

## 2020-10-24 NOTE — Telephone Encounter (Signed)
Consuella Lose (pt's sister) called and states that pt is mentally out of it. She states that he just got out of [facility] and they told her to call his primary dr. I let her know that Fransico Setters, NP is no longer practicing here. She states that she needs help today! I did not see her on the DPR and let her know this. She states that he has no idea who he is and she is so overwhelmed with what to do. Please advise

## 2020-10-24 NOTE — Telephone Encounter (Signed)
Agree - if increased confusion - need for evaluation.  Agree with evaluation now.

## 2021-11-12 IMAGING — CR DG CHEST 2V
1 series · 2 of 2 positions shown · non-contrast
Comparison: None.

CLINICAL DATA: Shortness of breath and blood in stool. Nonsmoker.
Possible COVID

EXAM:
CHEST - 2 VIEW

[Series 1: dg chest 2 view · 0.14mm/px · 2 of 2 slices shown]
[im 1/2]
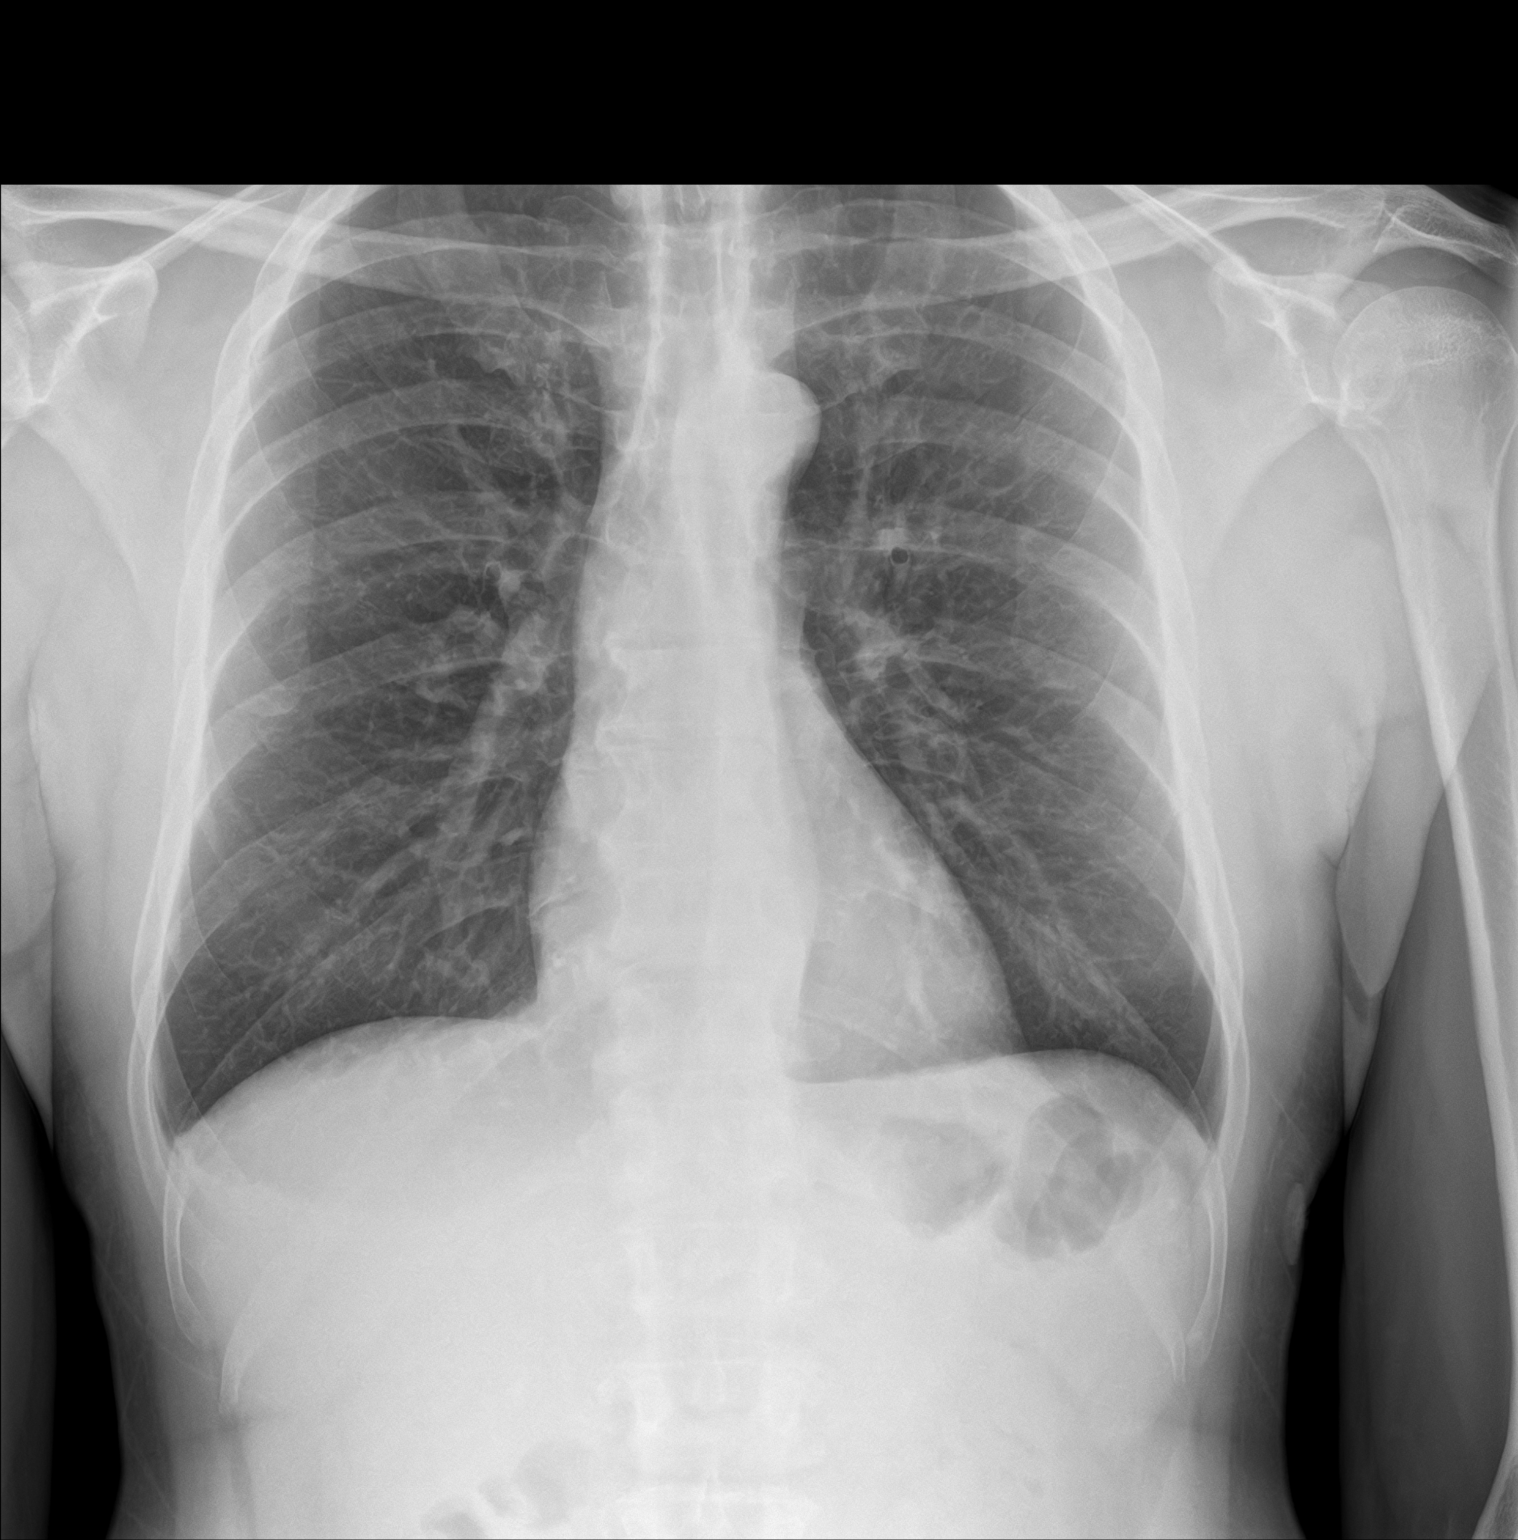
[im 2/2]
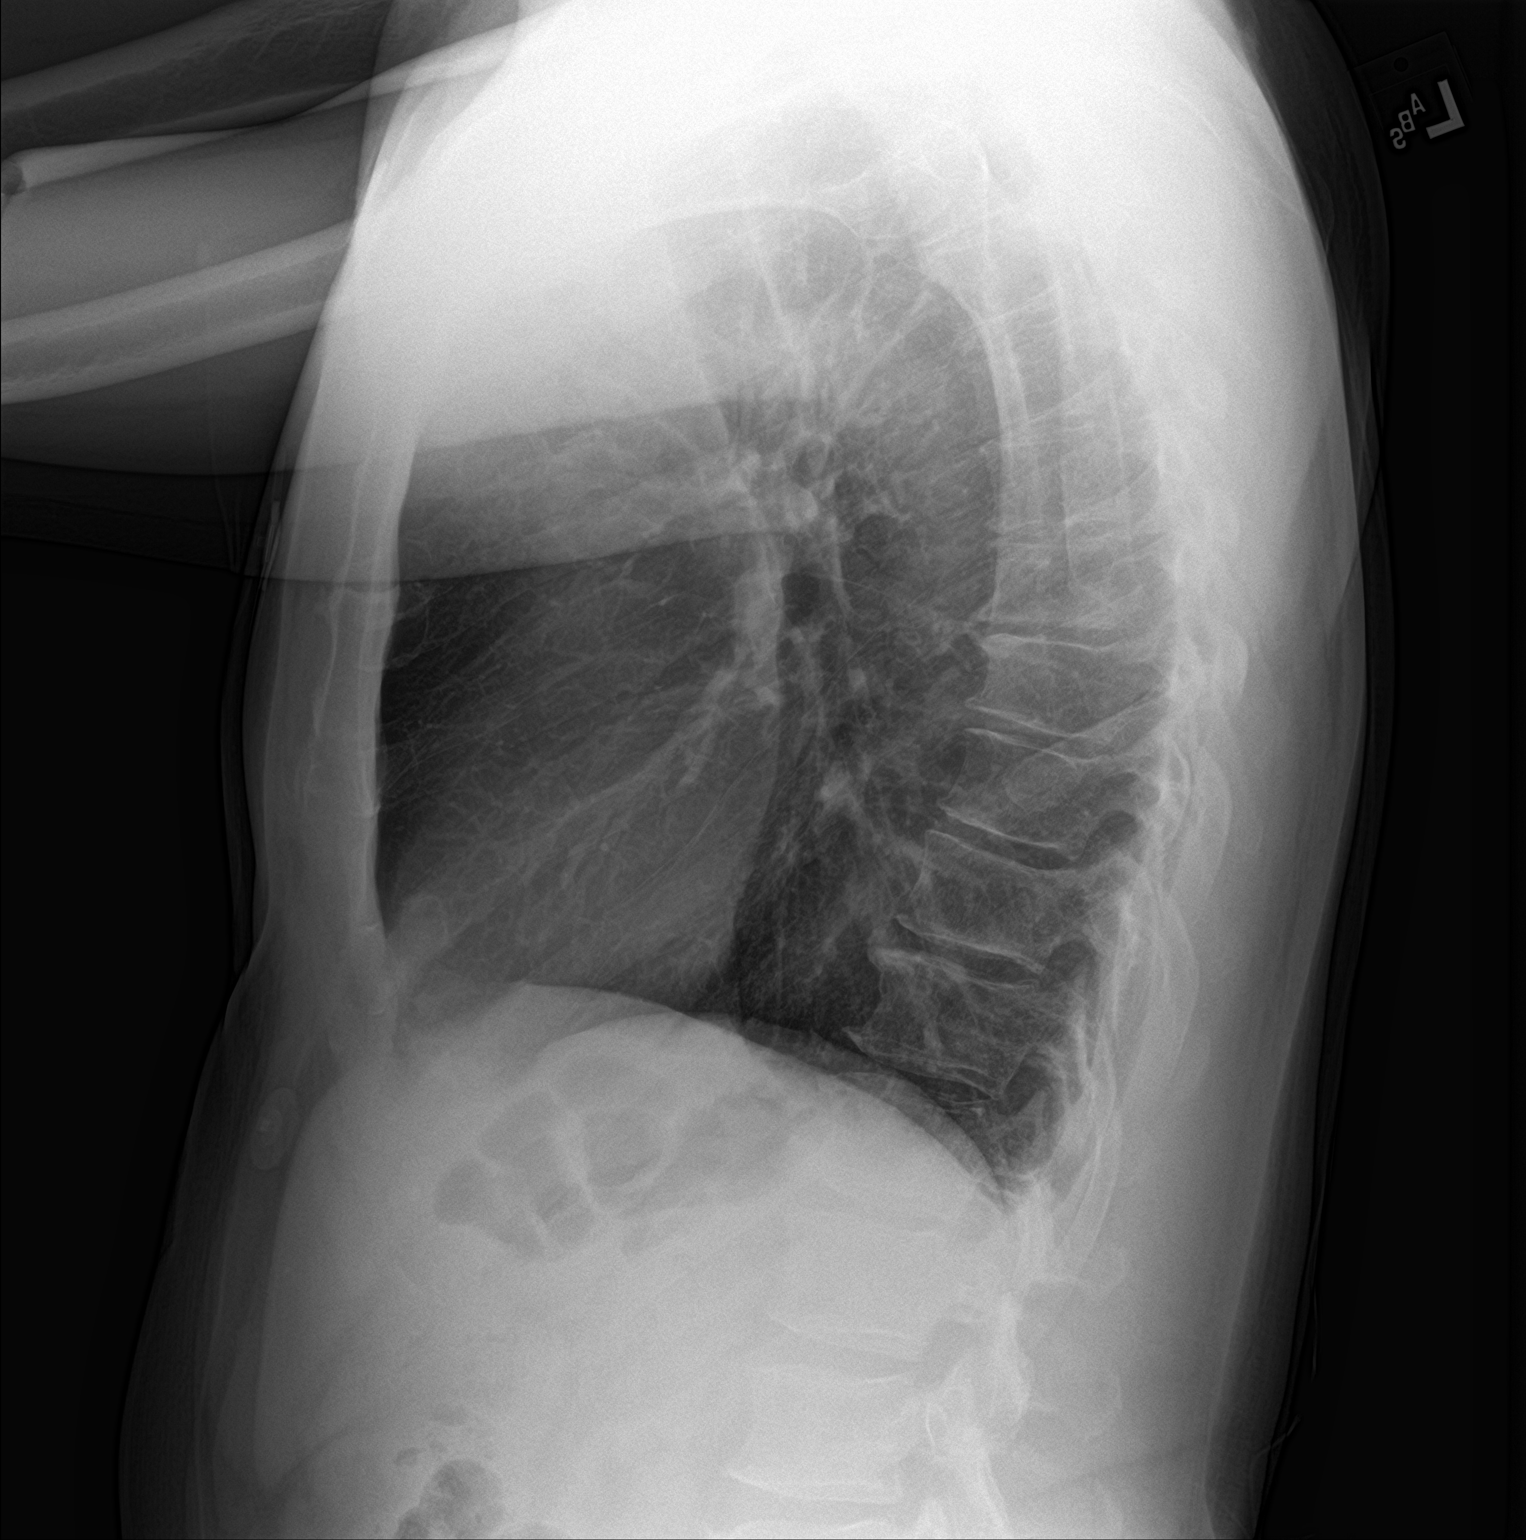

[2 of 2 positions shown; findings below may reference images not displayed]

FINDINGS: Normal heart size and pulmonary vascularity. Hyperinflation of the
lungs, possibly emphysema. No airspace disease or consolidation. No
pleural effusions. No pneumothorax. Mediastinal contours appear
intact. Mild degenerative changes in the spine.
IMPRESSION: No active cardiopulmonary disease.

## 2021-11-21 IMAGING — CR DG ABDOMEN 1V
1 series · 2 of 2 positions shown · non-contrast
Comparison: Abdominal radiograph dated 11/28/2019.

CLINICAL DATA: 54-year-old male with constipation

EXAM:
ABDOMEN - 1 VIEW

[Series 1: dg abd 1 view · 0.14mm/px · 2 of 2 slices shown]
[im 1/2]
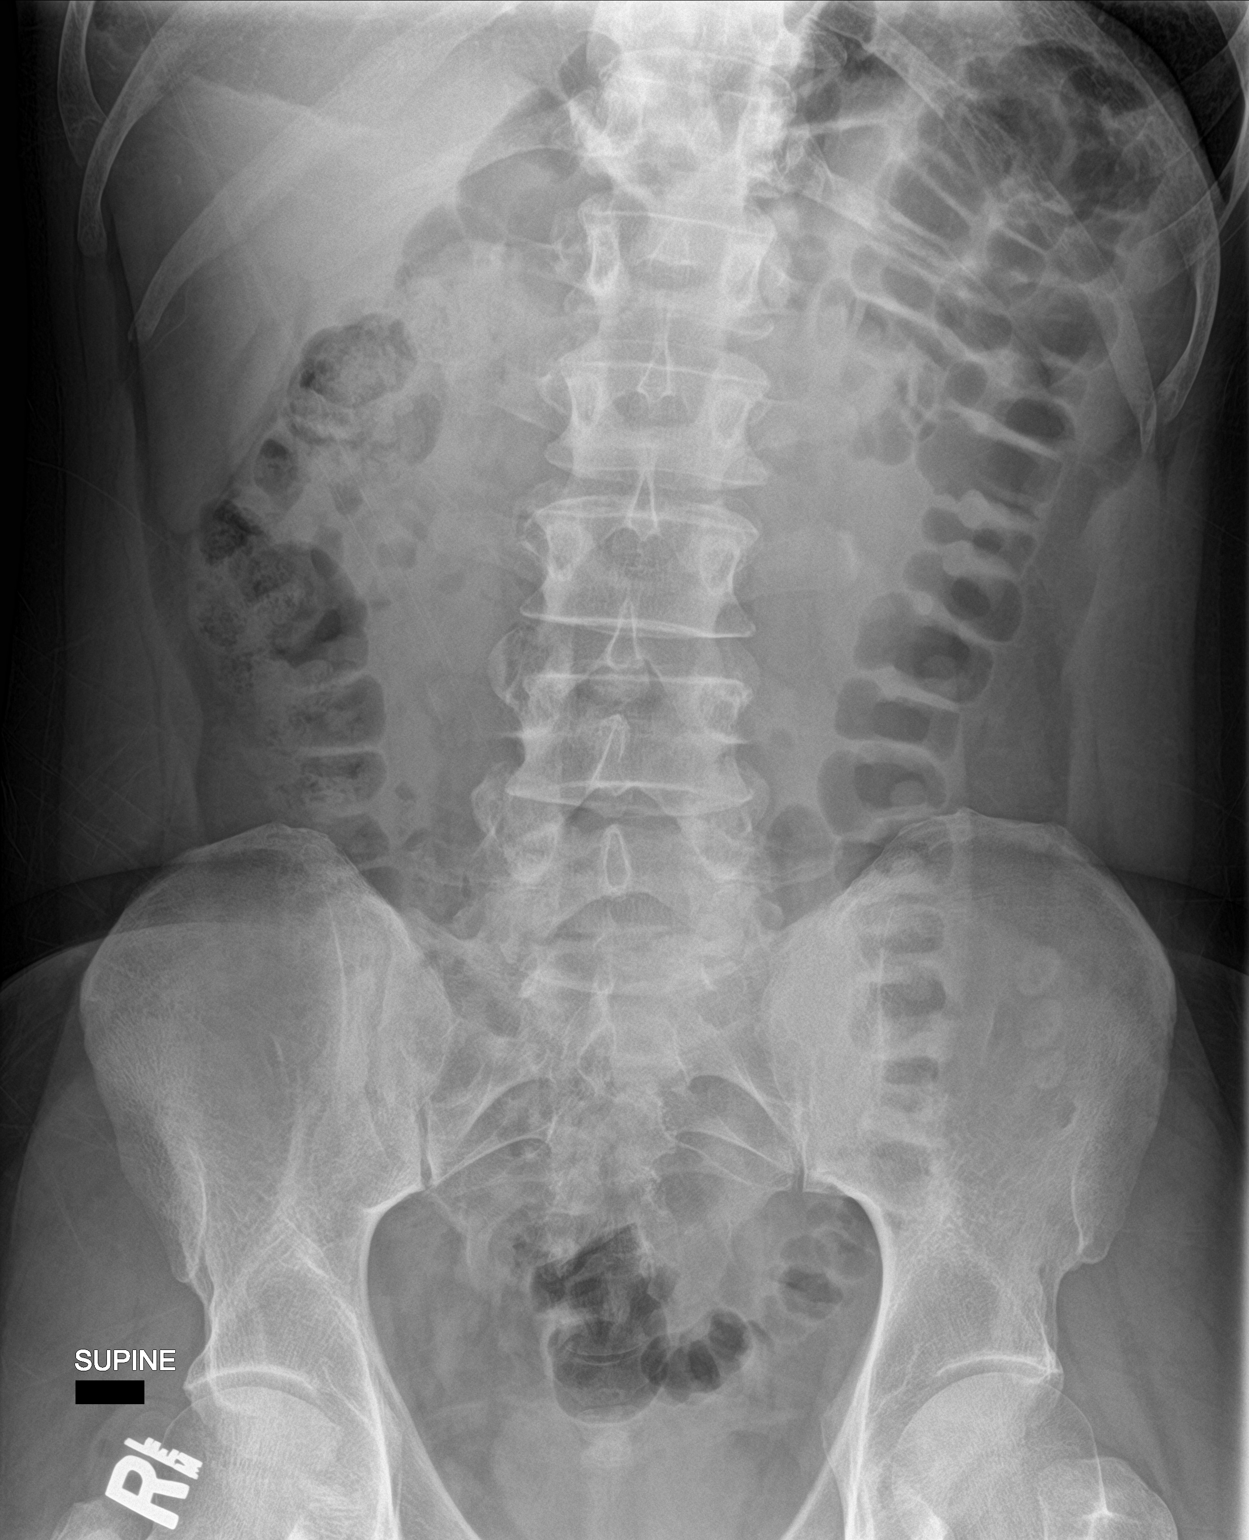
[im 2/2]
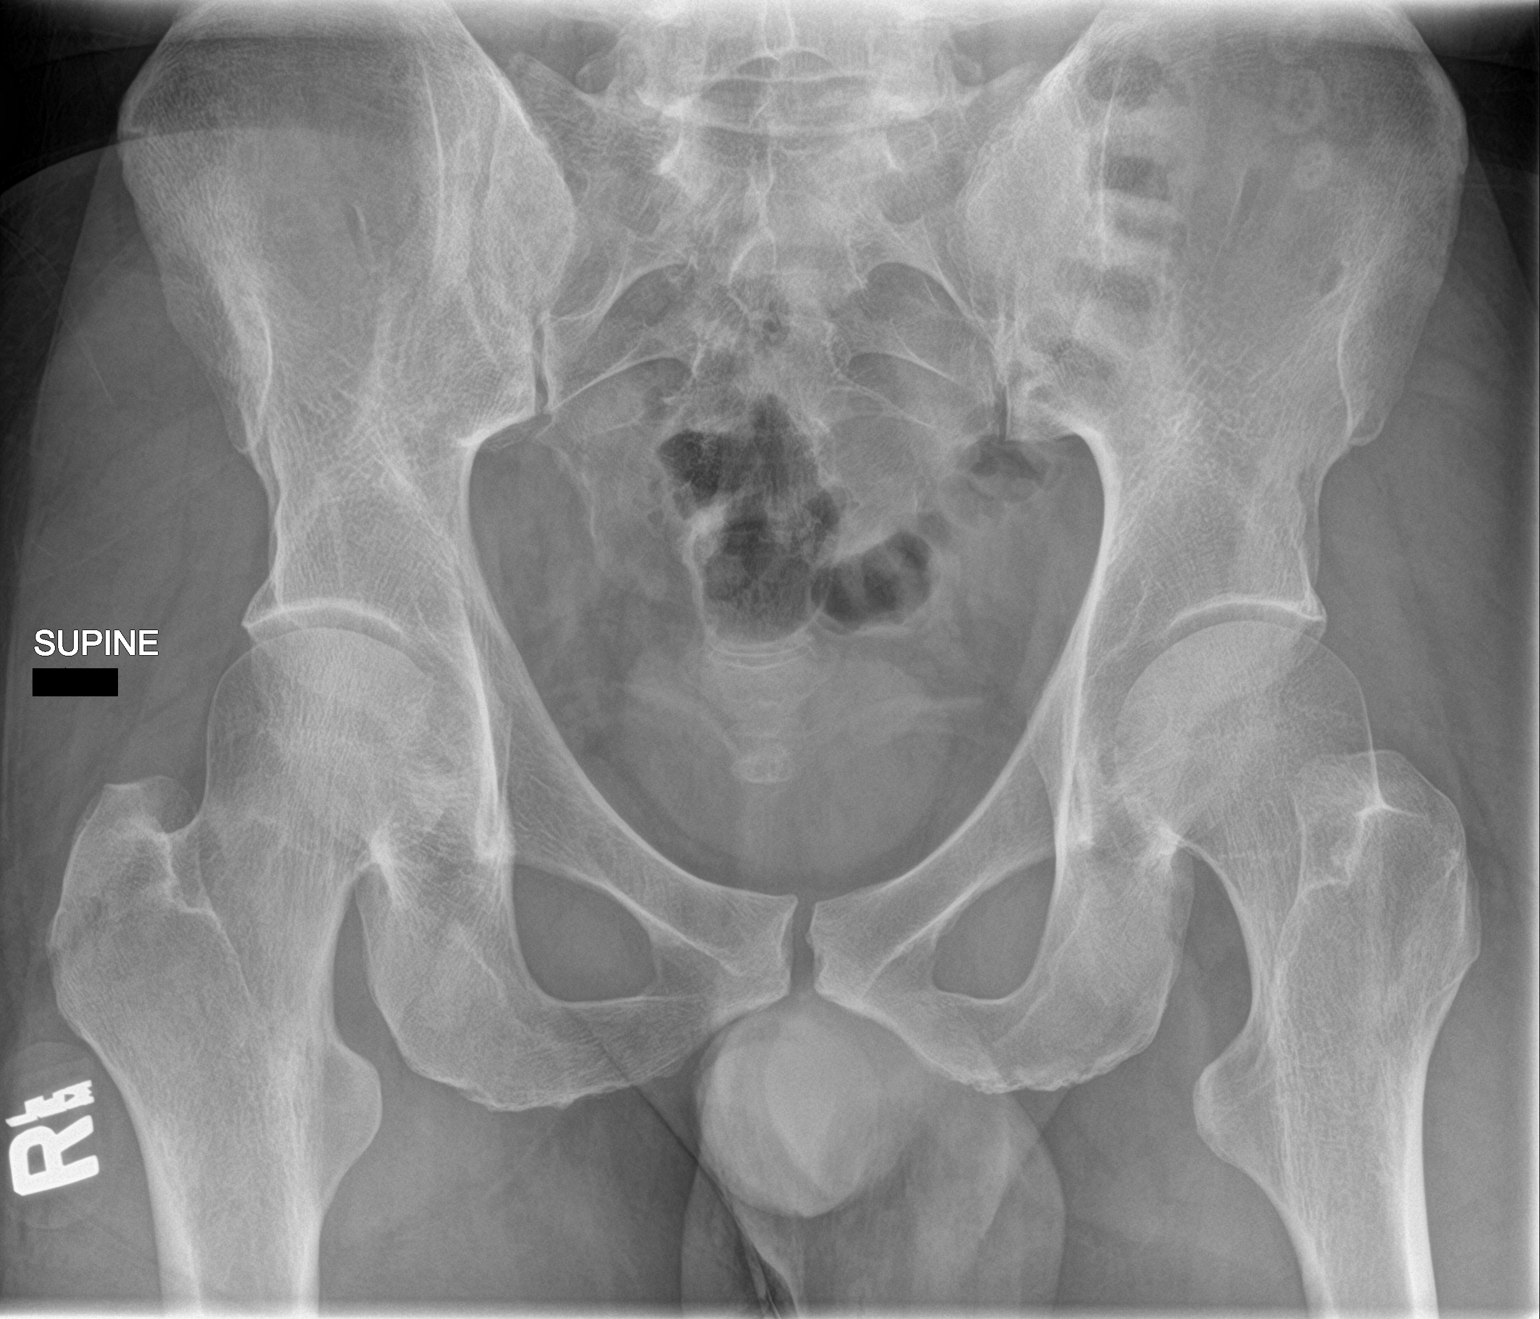

[2 of 2 positions shown; findings below may reference images not displayed]

FINDINGS: The bowel gas pattern is normal. No radio-opaque calculi or other
significant radiographic abnormality are seen.
IMPRESSION: Negative.
